# Patient Record
Sex: Male | Born: 1945 | Race: White | Hispanic: No | Marital: Married | Smoking: Never smoker
Health system: Southern US, Community
[De-identification: ages and names within clinical notes are randomized; demographics above are authoritative.]

## PROBLEM LIST (undated history)

## (undated) DIAGNOSIS — I82409 Acute embolism and thrombosis of unspecified deep veins of unspecified lower extremity: Secondary | ICD-10-CM

## (undated) DIAGNOSIS — I10 Essential (primary) hypertension: Secondary | ICD-10-CM

## (undated) DIAGNOSIS — N289 Disorder of kidney and ureter, unspecified: Secondary | ICD-10-CM

## (undated) HISTORY — PX: HERNIA REPAIR: SHX51

## (undated) HISTORY — PX: CHOLECYSTECTOMY: SHX55

---

## 2001-04-18 ENCOUNTER — Emergency Department (HOSPITAL_COMMUNITY): Admission: EM | Admit: 2001-04-18 | Discharge: 2001-04-18 | Payer: Self-pay | Admitting: Emergency Medicine

## 2001-04-18 ENCOUNTER — Encounter: Payer: Self-pay | Admitting: Emergency Medicine

## 2001-09-16 ENCOUNTER — Emergency Department (HOSPITAL_COMMUNITY): Admission: EM | Admit: 2001-09-16 | Discharge: 2001-09-16 | Payer: Self-pay | Admitting: Emergency Medicine

## 2005-09-21 ENCOUNTER — Ambulatory Visit (HOSPITAL_COMMUNITY): Admission: RE | Admit: 2005-09-21 | Discharge: 2005-09-21 | Payer: Self-pay | Admitting: Internal Medicine

## 2005-09-21 ENCOUNTER — Encounter: Payer: Self-pay | Admitting: Vascular Surgery

## 2005-09-21 ENCOUNTER — Ambulatory Visit: Payer: Self-pay | Admitting: Internal Medicine

## 2005-09-25 ENCOUNTER — Ambulatory Visit: Payer: Self-pay | Admitting: *Deleted

## 2005-09-25 ENCOUNTER — Ambulatory Visit: Payer: Self-pay | Admitting: Internal Medicine

## 2005-10-08 ENCOUNTER — Ambulatory Visit: Payer: Self-pay | Admitting: Internal Medicine

## 2005-12-21 ENCOUNTER — Ambulatory Visit: Payer: Self-pay | Admitting: Internal Medicine

## 2008-07-21 ENCOUNTER — Ambulatory Visit: Payer: Self-pay | Admitting: Internal Medicine

## 2008-07-21 DIAGNOSIS — I447 Left bundle-branch block, unspecified: Secondary | ICD-10-CM

## 2008-07-21 DIAGNOSIS — Z87442 Personal history of urinary calculi: Secondary | ICD-10-CM

## 2008-07-21 DIAGNOSIS — F528 Other sexual dysfunction not due to a substance or known physiological condition: Secondary | ICD-10-CM

## 2008-07-21 LAB — CONVERTED CEMR LAB
Alkaline Phosphatase: 58 units/L (ref 39–117)
BUN: 16 mg/dL (ref 6–23)
Basophils Relative: 0.1 % (ref 0.0–3.0)
Bilirubin Urine: NEGATIVE
Bilirubin, Direct: 0.1 mg/dL (ref 0.0–0.3)
Calcium: 9.2 mg/dL (ref 8.4–10.5)
Chloride: 110 meq/L (ref 96–112)
Creatinine, Ser: 1 mg/dL (ref 0.4–1.5)
Eosinophils Absolute: 0.1 10*3/uL (ref 0.0–0.7)
Eosinophils Relative: 1.5 % (ref 0.0–5.0)
HDL: 37.9 mg/dL — ABNORMAL LOW (ref >39.00)
Hemoglobin, Urine: NEGATIVE
Ketones, ur: NEGATIVE mg/dL
LDL Cholesterol: 142 mg/dL — ABNORMAL HIGH (ref 0–99)
Lymphocytes Relative: 32.9 % (ref 12.0–46.0)
MCHC: 35.1 g/dL (ref 30.0–36.0)
MCV: 95.1 fL (ref 78.0–100.0)
Monocytes Absolute: 0.5 10*3/uL (ref 0.1–1.0)
Neutrophils Relative %: 57.6 % (ref 43.0–77.0)
Platelets: 174 10*3/uL (ref 150.0–400.0)
RBC: 4.95 M/uL (ref 4.22–5.81)
TSH: 1.95 microintl units/mL (ref 0.35–5.50)
Total Bilirubin: 0.9 mg/dL (ref 0.3–1.2)
Total CHOL/HDL Ratio: 5
Total Protein, Urine: NEGATIVE mg/dL
Total Protein: 7.5 g/dL (ref 6.0–8.3)
Triglycerides: 99 mg/dL (ref 0.0–149.0)
Urine Glucose: NEGATIVE mg/dL
Urobilinogen, UA: 0.2 (ref 0.0–1.0)
WBC: 6.2 10*3/uL (ref 4.5–10.5)

## 2008-07-23 ENCOUNTER — Telehealth (INDEPENDENT_AMBULATORY_CARE_PROVIDER_SITE_OTHER): Payer: Self-pay | Admitting: *Deleted

## 2008-08-06 ENCOUNTER — Emergency Department (HOSPITAL_COMMUNITY): Admission: EM | Admit: 2008-08-06 | Discharge: 2008-08-06 | Payer: Self-pay | Admitting: Emergency Medicine

## 2008-08-10 ENCOUNTER — Ambulatory Visit: Payer: Self-pay | Admitting: Cardiovascular Disease

## 2008-08-10 ENCOUNTER — Ambulatory Visit: Payer: Self-pay

## 2008-08-10 ENCOUNTER — Encounter: Payer: Self-pay | Admitting: Internal Medicine

## 2008-08-24 ENCOUNTER — Encounter: Payer: Self-pay | Admitting: Internal Medicine

## 2008-09-20 ENCOUNTER — Encounter: Payer: Self-pay | Admitting: Internal Medicine

## 2008-09-23 ENCOUNTER — Encounter: Payer: Self-pay | Admitting: Internal Medicine

## 2008-09-28 ENCOUNTER — Ambulatory Visit: Payer: Self-pay | Admitting: Internal Medicine

## 2008-10-07 ENCOUNTER — Ambulatory Visit (HOSPITAL_COMMUNITY): Admission: RE | Admit: 2008-10-07 | Discharge: 2008-10-07 | Payer: Self-pay | Admitting: Urology

## 2008-10-28 ENCOUNTER — Encounter: Payer: Self-pay | Admitting: Internal Medicine

## 2009-07-21 ENCOUNTER — Telehealth (INDEPENDENT_AMBULATORY_CARE_PROVIDER_SITE_OTHER): Payer: Self-pay | Admitting: *Deleted

## 2010-04-06 NOTE — Progress Notes (Signed)
  Phone Note Other Incoming   Request: Send information Summary of Call: Kevin Caldwell medical release received from patient requesting copies of his records from January 2007 to May 2011. Request forwarded to [employer].

## 2010-06-12 LAB — URINALYSIS, ROUTINE W REFLEX MICROSCOPIC
Glucose, UA: NEGATIVE mg/dL
Ketones, ur: NEGATIVE mg/dL
Leukocytes, UA: NEGATIVE
Nitrite: NEGATIVE
Protein, ur: NEGATIVE mg/dL
Specific Gravity, Urine: 1.03 (ref 1.005–1.030)
Urobilinogen, UA: 0.2 mg/dL (ref 0.0–1.0)
pH: 5 (ref 5.0–8.0)

## 2010-06-12 LAB — URINE MICROSCOPIC-ADD ON

## 2010-07-21 NOTE — Assessment & Plan Note (Signed)
[facility]                             PRIMARY CARE OFFICE NOTE   WEST, BOOMERSHINE                       MRN:          [id number]  DATE:09/21/2005                            DOB:          Mar 27, 1945    CHIEF COMPLAINT:  New patient to practice referred by Dr. Andrey Campanile.   HISTORY OF PRESENT ILLNESS:  The patient is a 65 year old white male who has  been relatively healthy for most of his life here to establish primary care.  [facility] and flu-like  symptoms.  He recently returned from a trip to the [place]. He works in  the heating and air conditioning business and spent several days in [place]  as well as [place] and [place].  He states that he was in his usual state of  health before this trip.  However, within the first 12 hours of arriving in  [place], the patient experienced an abdominal taste in his mouth and also flu-  like symptoms with fever and headache.  The patient also during his trip had  intermittent loose stools and watery diarrhea.  He attributes these loose  stools to change in diet and eating more fruits while he was on this trip.  His loose stools have started to resolve.   Upon his return he [facility] and  numerous labs were obtained including tests for malaria and a TB skin test  was checked.  EKG was checked as well as a chest x-ray.  The patient notes  that he has history of left bundle branch block.  It is unclear how old this  is.  Apparently a 2-D echo was also ordered and a preliminary report showed  normal ejection fraction.   He describes cutting a small tree in his back yard prior to his trip.  A  tree limb grazed the right side of his head.  He does not know if that  incident has anything to do with some mild discomfort in his left eye.  He  states that his eyelids sometimes are sticking together.   REVIEW OF SYSTEMS:  As  noted above, he does note some increased ankle  swelling during the trip.  He was on a plane flight for a prolonged time.  Ankle swelling has come undone.  However, over the last 24 to 48 hours, he  has noticed increased swelling in his left lower extremity with streaking  near his ankle.   PAST MEDICAL HISTORY:  1.  Status post left inguinal hernia repair in 1974.  2.  Status post cholecystectomy in 1980s.  3.  Obesity.   CURRENT MEDICATIONS:  He is finishing a course of azithromycin.   ALLERGIES:  None noted.   SOCIAL HISTORY:  The patient is married.  Has two children.  Denies any  alcohol or tobacco use.  No history of recreational drug use.  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   FAMILY HISTORY:  The patient's mother is  deceased at age 21.  Had a past  medical history of type 2 diabetes, stroke, left-sided paralysis.  Father is  still alive at age 66 and has history of coronary artery disease, colon  cancer, status post colectomy.  Colon cancer occurred late in life.   PREVENTIVE CARE HISTORY:  He has had a colonoscopy back in 2004 which was  reported as unremarkable.   PHYSICAL EXAMINATION:  VITAL SIGNS: Height is 5 feet 10 inches, weight 266  pounds.  Temperature 97.5, pulse 68, BP 131/85.  GENERAL:  The patient is a pleasant, overweight 65 year old white male in no  apparent distress.  He appears his stated age.  Appears somewhat washed out.  HEENT:  Normocephalic, atraumatic.  Pupils are equal and reactive to light  bilaterally.  Extraocular movements intact.  Patient ws anicteric.  Conjunctivae within normal limits.  There was no conjunctival petechiae.  Tympanic membranes clear bilaterally. No sign of infection.  Oropharyngeal  exam:  Crowded oral airway.  However, no other oropharyngeal lesions were  noted.  The patient had normal dentition.  NECK:  Supple.  No adenopathy, carotid bruits or thyromegaly.  CHEST:   Normal respiratory effort.  Chest is clear to auscultation  bilaterally.  No rhonchi, rales or wheezing.  CARDIOVASCULAR:  Regular rate and rhythm.  No significant murmurs, rubs or  gallops appreciated.  ABDOMEN:  Protuberant, obese.  He has a right upper quadrant scar.  No  obvious hernias and unable to appreciate hepatosplenomegaly. Examination of  his axilla and groin revealed some fullness in his right axilla, maybe  associated with lymphadenopathy.  There was no lymphadenopathy in his  inguinal area bilaterally.  SKIN:  No obvious rashes in his upper torso.  EXTREMITIES:  Examination of the lower extremities revealed an area of  erythema above his ankle that was circumferential, associated with pitting  edema.  The patient had mild tenderness in that area.  Pedis dorsalis pulses  were equal and symmetric.  NEUROLOGIC:  Cranial nerves II-XII intact.  He was nonfocal.   IMPRESSION/RECOMMENDATIONS:  1.  Fever with flu-like symptoms of unclear etiology.  2.  Left lower extremity edema, possible cellulitis versus deep venous      thrombosis.  3.  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  4.  Health maintenance.   RECOMMENDATIONS:  At this time we will broaden out data base.  I am somewhat  concerned that his left lower extremity may be secondary to DVT.  He  certainly has multiple risk factors including prolonged plane ride over to  the [place].  Some of his fever certainly could be attributable to  possible deep venous thrombosis.  However, also concerning his possible  lymphadenopathy in his right axilla.  We will try to obtain his blood test  results. Hopefully they  have the blood cultures and a copy of his chest x-ray, EKG and recent 2-D  echo.  Further management will be based upon testing results.                                   Barbette Hair. Artist Pais, DO   RDY/MedQ  DD:  09/21/2005  DT:  09/21/2005  Job #:  [id number]

## 2011-03-21 DIAGNOSIS — E291 Testicular hypofunction: Secondary | ICD-10-CM | POA: Diagnosis not present

## 2011-04-11 DIAGNOSIS — E291 Testicular hypofunction: Secondary | ICD-10-CM | POA: Diagnosis not present

## 2011-05-02 DIAGNOSIS — E291 Testicular hypofunction: Secondary | ICD-10-CM | POA: Diagnosis not present

## 2011-05-23 DIAGNOSIS — E291 Testicular hypofunction: Secondary | ICD-10-CM | POA: Diagnosis not present

## 2011-06-13 DIAGNOSIS — E291 Testicular hypofunction: Secondary | ICD-10-CM | POA: Diagnosis not present

## 2011-07-04 DIAGNOSIS — E291 Testicular hypofunction: Secondary | ICD-10-CM | POA: Diagnosis not present

## 2011-07-25 DIAGNOSIS — E291 Testicular hypofunction: Secondary | ICD-10-CM | POA: Diagnosis not present

## 2011-08-23 DIAGNOSIS — Z0000 Encounter for general adult medical examination without abnormal findings: Secondary | ICD-10-CM | POA: Diagnosis not present

## 2011-08-23 DIAGNOSIS — E559 Vitamin D deficiency, unspecified: Secondary | ICD-10-CM | POA: Diagnosis not present

## 2011-08-23 DIAGNOSIS — E669 Obesity, unspecified: Secondary | ICD-10-CM | POA: Diagnosis not present

## 2011-08-23 DIAGNOSIS — Z23 Encounter for immunization: Secondary | ICD-10-CM | POA: Diagnosis not present

## 2011-08-23 DIAGNOSIS — E291 Testicular hypofunction: Secondary | ICD-10-CM | POA: Diagnosis not present

## 2011-08-23 DIAGNOSIS — E785 Hyperlipidemia, unspecified: Secondary | ICD-10-CM | POA: Diagnosis not present

## 2011-08-23 DIAGNOSIS — Z79899 Other long term (current) drug therapy: Secondary | ICD-10-CM | POA: Diagnosis not present

## 2011-08-23 DIAGNOSIS — Z1331 Encounter for screening for depression: Secondary | ICD-10-CM | POA: Diagnosis not present

## 2011-09-12 DIAGNOSIS — E291 Testicular hypofunction: Secondary | ICD-10-CM | POA: Diagnosis not present

## 2011-10-03 DIAGNOSIS — E291 Testicular hypofunction: Secondary | ICD-10-CM | POA: Diagnosis not present

## 2011-10-23 DIAGNOSIS — E291 Testicular hypofunction: Secondary | ICD-10-CM | POA: Diagnosis not present

## 2011-11-15 DIAGNOSIS — E291 Testicular hypofunction: Secondary | ICD-10-CM | POA: Diagnosis not present

## 2011-12-05 DIAGNOSIS — E291 Testicular hypofunction: Secondary | ICD-10-CM | POA: Diagnosis not present

## 2011-12-26 DIAGNOSIS — E291 Testicular hypofunction: Secondary | ICD-10-CM | POA: Diagnosis not present

## 2012-01-16 DIAGNOSIS — E291 Testicular hypofunction: Secondary | ICD-10-CM | POA: Diagnosis not present

## 2012-02-06 DIAGNOSIS — E291 Testicular hypofunction: Secondary | ICD-10-CM | POA: Diagnosis not present

## 2012-02-06 DIAGNOSIS — Z23 Encounter for immunization: Secondary | ICD-10-CM | POA: Diagnosis not present

## 2012-03-19 DIAGNOSIS — E291 Testicular hypofunction: Secondary | ICD-10-CM | POA: Diagnosis not present

## 2012-04-09 DIAGNOSIS — E291 Testicular hypofunction: Secondary | ICD-10-CM | POA: Diagnosis not present

## 2012-05-21 DIAGNOSIS — E291 Testicular hypofunction: Secondary | ICD-10-CM | POA: Diagnosis not present

## 2012-06-11 DIAGNOSIS — E291 Testicular hypofunction: Secondary | ICD-10-CM | POA: Diagnosis not present

## 2012-07-02 DIAGNOSIS — E291 Testicular hypofunction: Secondary | ICD-10-CM | POA: Diagnosis not present

## 2012-07-16 DIAGNOSIS — E291 Testicular hypofunction: Secondary | ICD-10-CM | POA: Diagnosis not present

## 2012-08-06 DIAGNOSIS — E291 Testicular hypofunction: Secondary | ICD-10-CM | POA: Diagnosis not present

## 2012-09-08 DIAGNOSIS — Z1331 Encounter for screening for depression: Secondary | ICD-10-CM | POA: Diagnosis not present

## 2012-09-08 DIAGNOSIS — Z125 Encounter for screening for malignant neoplasm of prostate: Secondary | ICD-10-CM | POA: Diagnosis not present

## 2012-09-08 DIAGNOSIS — I447 Left bundle-branch block, unspecified: Secondary | ICD-10-CM | POA: Diagnosis not present

## 2012-09-08 DIAGNOSIS — E785 Hyperlipidemia, unspecified: Secondary | ICD-10-CM | POA: Diagnosis not present

## 2012-09-08 DIAGNOSIS — Z79899 Other long term (current) drug therapy: Secondary | ICD-10-CM | POA: Diagnosis not present

## 2012-09-08 DIAGNOSIS — E559 Vitamin D deficiency, unspecified: Secondary | ICD-10-CM | POA: Diagnosis not present

## 2012-09-08 DIAGNOSIS — E291 Testicular hypofunction: Secondary | ICD-10-CM | POA: Diagnosis not present

## 2012-09-08 DIAGNOSIS — Z0000 Encounter for general adult medical examination without abnormal findings: Secondary | ICD-10-CM | POA: Diagnosis not present

## 2012-09-08 DIAGNOSIS — E669 Obesity, unspecified: Secondary | ICD-10-CM | POA: Diagnosis not present

## 2012-09-29 DIAGNOSIS — E291 Testicular hypofunction: Secondary | ICD-10-CM | POA: Diagnosis not present

## 2012-10-20 DIAGNOSIS — E291 Testicular hypofunction: Secondary | ICD-10-CM | POA: Diagnosis not present

## 2012-11-21 DIAGNOSIS — E291 Testicular hypofunction: Secondary | ICD-10-CM | POA: Diagnosis not present

## 2012-12-02 DIAGNOSIS — E291 Testicular hypofunction: Secondary | ICD-10-CM | POA: Diagnosis not present

## 2012-12-22 DIAGNOSIS — Z23 Encounter for immunization: Secondary | ICD-10-CM | POA: Diagnosis not present

## 2012-12-22 DIAGNOSIS — E291 Testicular hypofunction: Secondary | ICD-10-CM | POA: Diagnosis not present

## 2013-01-13 DIAGNOSIS — E291 Testicular hypofunction: Secondary | ICD-10-CM | POA: Diagnosis not present

## 2013-02-03 DIAGNOSIS — E291 Testicular hypofunction: Secondary | ICD-10-CM | POA: Diagnosis not present

## 2013-02-24 DIAGNOSIS — H5200 Hypermetropia, unspecified eye: Secondary | ICD-10-CM | POA: Diagnosis not present

## 2013-02-24 DIAGNOSIS — H52209 Unspecified astigmatism, unspecified eye: Secondary | ICD-10-CM | POA: Diagnosis not present

## 2013-02-24 DIAGNOSIS — E291 Testicular hypofunction: Secondary | ICD-10-CM | POA: Diagnosis not present

## 2013-02-24 DIAGNOSIS — H524 Presbyopia: Secondary | ICD-10-CM | POA: Diagnosis not present

## 2013-03-17 DIAGNOSIS — E291 Testicular hypofunction: Secondary | ICD-10-CM | POA: Diagnosis not present

## 2013-04-07 DIAGNOSIS — E291 Testicular hypofunction: Secondary | ICD-10-CM | POA: Diagnosis not present

## 2013-05-05 DIAGNOSIS — E291 Testicular hypofunction: Secondary | ICD-10-CM | POA: Diagnosis not present

## 2013-05-13 DIAGNOSIS — B353 Tinea pedis: Secondary | ICD-10-CM | POA: Diagnosis not present

## 2013-05-13 DIAGNOSIS — L739 Follicular disorder, unspecified: Secondary | ICD-10-CM | POA: Diagnosis not present

## 2013-05-13 DIAGNOSIS — B351 Tinea unguium: Secondary | ICD-10-CM | POA: Diagnosis not present

## 2013-05-13 DIAGNOSIS — L723 Sebaceous cyst: Secondary | ICD-10-CM | POA: Diagnosis not present

## 2013-05-13 DIAGNOSIS — L719 Rosacea, unspecified: Secondary | ICD-10-CM | POA: Diagnosis not present

## 2013-05-13 DIAGNOSIS — L821 Other seborrheic keratosis: Secondary | ICD-10-CM | POA: Diagnosis not present

## 2013-05-25 DIAGNOSIS — E291 Testicular hypofunction: Secondary | ICD-10-CM | POA: Diagnosis not present

## 2013-06-16 DIAGNOSIS — E291 Testicular hypofunction: Secondary | ICD-10-CM | POA: Diagnosis not present

## 2013-07-07 DIAGNOSIS — E291 Testicular hypofunction: Secondary | ICD-10-CM | POA: Diagnosis not present

## 2013-07-15 DIAGNOSIS — B351 Tinea unguium: Secondary | ICD-10-CM | POA: Diagnosis not present

## 2013-07-15 DIAGNOSIS — L821 Other seborrheic keratosis: Secondary | ICD-10-CM | POA: Diagnosis not present

## 2013-07-15 DIAGNOSIS — L719 Rosacea, unspecified: Secondary | ICD-10-CM | POA: Diagnosis not present

## 2013-07-15 DIAGNOSIS — L570 Actinic keratosis: Secondary | ICD-10-CM | POA: Diagnosis not present

## 2013-07-15 DIAGNOSIS — L820 Inflamed seborrheic keratosis: Secondary | ICD-10-CM | POA: Diagnosis not present

## 2013-07-15 DIAGNOSIS — L739 Follicular disorder, unspecified: Secondary | ICD-10-CM | POA: Diagnosis not present

## 2013-07-28 DIAGNOSIS — E291 Testicular hypofunction: Secondary | ICD-10-CM | POA: Diagnosis not present

## 2013-08-18 DIAGNOSIS — E291 Testicular hypofunction: Secondary | ICD-10-CM | POA: Diagnosis not present

## 2013-09-08 DIAGNOSIS — E291 Testicular hypofunction: Secondary | ICD-10-CM | POA: Diagnosis not present

## 2013-09-15 DIAGNOSIS — E785 Hyperlipidemia, unspecified: Secondary | ICD-10-CM | POA: Diagnosis not present

## 2013-09-15 DIAGNOSIS — Z125 Encounter for screening for malignant neoplasm of prostate: Secondary | ICD-10-CM | POA: Diagnosis not present

## 2013-09-15 DIAGNOSIS — Z0000 Encounter for general adult medical examination without abnormal findings: Secondary | ICD-10-CM | POA: Diagnosis not present

## 2013-09-15 DIAGNOSIS — Z1331 Encounter for screening for depression: Secondary | ICD-10-CM | POA: Diagnosis not present

## 2013-09-15 DIAGNOSIS — E669 Obesity, unspecified: Secondary | ICD-10-CM | POA: Diagnosis not present

## 2013-09-15 DIAGNOSIS — E291 Testicular hypofunction: Secondary | ICD-10-CM | POA: Diagnosis not present

## 2013-09-15 DIAGNOSIS — E559 Vitamin D deficiency, unspecified: Secondary | ICD-10-CM | POA: Diagnosis not present

## 2013-09-15 DIAGNOSIS — R030 Elevated blood-pressure reading, without diagnosis of hypertension: Secondary | ICD-10-CM | POA: Diagnosis not present

## 2013-09-15 DIAGNOSIS — Z86010 Personal history of colonic polyps: Secondary | ICD-10-CM | POA: Diagnosis not present

## 2013-09-15 DIAGNOSIS — I447 Left bundle-branch block, unspecified: Secondary | ICD-10-CM | POA: Diagnosis not present

## 2013-09-29 DIAGNOSIS — E291 Testicular hypofunction: Secondary | ICD-10-CM | POA: Diagnosis not present

## 2013-10-27 DIAGNOSIS — R030 Elevated blood-pressure reading, without diagnosis of hypertension: Secondary | ICD-10-CM | POA: Diagnosis not present

## 2013-10-27 DIAGNOSIS — E291 Testicular hypofunction: Secondary | ICD-10-CM | POA: Diagnosis not present

## 2013-11-16 DIAGNOSIS — E785 Hyperlipidemia, unspecified: Secondary | ICD-10-CM

## 2013-12-01 DIAGNOSIS — E291 Testicular hypofunction: Secondary | ICD-10-CM | POA: Diagnosis not present

## 2013-12-29 DIAGNOSIS — E291 Testicular hypofunction: Secondary | ICD-10-CM | POA: Diagnosis not present

## 2014-01-21 DIAGNOSIS — Z09 Encounter for follow-up examination after completed treatment for conditions other than malignant neoplasm: Secondary | ICD-10-CM | POA: Diagnosis not present

## 2014-01-21 DIAGNOSIS — Z86010 Personal history of colonic polyps: Secondary | ICD-10-CM | POA: Diagnosis not present

## 2014-01-26 DIAGNOSIS — E291 Testicular hypofunction: Secondary | ICD-10-CM | POA: Diagnosis not present

## 2014-01-26 DIAGNOSIS — Z23 Encounter for immunization: Secondary | ICD-10-CM | POA: Diagnosis not present

## 2014-02-09 DIAGNOSIS — E291 Testicular hypofunction: Secondary | ICD-10-CM | POA: Diagnosis not present

## 2014-03-23 DIAGNOSIS — E291 Testicular hypofunction: Secondary | ICD-10-CM | POA: Diagnosis not present

## 2014-04-14 DIAGNOSIS — E291 Testicular hypofunction: Secondary | ICD-10-CM | POA: Diagnosis not present

## 2014-05-04 DIAGNOSIS — E291 Testicular hypofunction: Secondary | ICD-10-CM | POA: Diagnosis not present

## 2014-05-18 DIAGNOSIS — Z52011 Autologous donor, stem cells: Secondary | ICD-10-CM

## 2014-05-25 DIAGNOSIS — E291 Testicular hypofunction: Secondary | ICD-10-CM | POA: Diagnosis not present

## 2014-07-06 DIAGNOSIS — E291 Testicular hypofunction: Secondary | ICD-10-CM | POA: Diagnosis not present

## 2014-09-07 DIAGNOSIS — E291 Testicular hypofunction: Secondary | ICD-10-CM | POA: Diagnosis not present

## 2014-09-20 DIAGNOSIS — Z125 Encounter for screening for malignant neoplasm of prostate: Secondary | ICD-10-CM | POA: Diagnosis not present

## 2014-09-20 DIAGNOSIS — Z79899 Other long term (current) drug therapy: Secondary | ICD-10-CM | POA: Diagnosis not present

## 2014-09-20 DIAGNOSIS — I809 Phlebitis and thrombophlebitis of unspecified site: Secondary | ICD-10-CM | POA: Diagnosis not present

## 2014-09-20 DIAGNOSIS — E291 Testicular hypofunction: Secondary | ICD-10-CM | POA: Diagnosis not present

## 2014-09-20 DIAGNOSIS — I447 Left bundle-branch block, unspecified: Secondary | ICD-10-CM | POA: Diagnosis not present

## 2014-09-20 DIAGNOSIS — Z0001 Encounter for general adult medical examination with abnormal findings: Secondary | ICD-10-CM | POA: Diagnosis not present

## 2014-09-20 DIAGNOSIS — Z6836 Body mass index (BMI) 36.0-36.9, adult: Secondary | ICD-10-CM | POA: Diagnosis not present

## 2014-09-20 DIAGNOSIS — M722 Plantar fascial fibromatosis: Secondary | ICD-10-CM | POA: Diagnosis not present

## 2014-09-20 DIAGNOSIS — I10 Essential (primary) hypertension: Secondary | ICD-10-CM | POA: Diagnosis not present

## 2014-09-20 DIAGNOSIS — Z1389 Encounter for screening for other disorder: Secondary | ICD-10-CM | POA: Diagnosis not present

## 2014-09-20 DIAGNOSIS — E784 Other hyperlipidemia: Secondary | ICD-10-CM | POA: Diagnosis not present

## 2014-09-20 DIAGNOSIS — E559 Vitamin D deficiency, unspecified: Secondary | ICD-10-CM | POA: Diagnosis not present

## 2014-11-17 DIAGNOSIS — Z23 Encounter for immunization: Secondary | ICD-10-CM | POA: Diagnosis not present

## 2014-11-17 DIAGNOSIS — E291 Testicular hypofunction: Secondary | ICD-10-CM | POA: Diagnosis not present

## 2014-11-30 DIAGNOSIS — E291 Testicular hypofunction: Secondary | ICD-10-CM | POA: Diagnosis not present

## 2014-11-30 DIAGNOSIS — I10 Essential (primary) hypertension: Secondary | ICD-10-CM | POA: Diagnosis not present

## 2014-11-30 DIAGNOSIS — Z6833 Body mass index (BMI) 33.0-33.9, adult: Secondary | ICD-10-CM | POA: Diagnosis not present

## 2015-01-19 DIAGNOSIS — E291 Testicular hypofunction: Secondary | ICD-10-CM | POA: Diagnosis not present

## 2015-02-11 DIAGNOSIS — E291 Testicular hypofunction: Secondary | ICD-10-CM | POA: Diagnosis not present

## 2015-03-03 DIAGNOSIS — E291 Testicular hypofunction: Secondary | ICD-10-CM | POA: Diagnosis not present

## 2015-03-23 DIAGNOSIS — E291 Testicular hypofunction: Secondary | ICD-10-CM | POA: Diagnosis not present

## 2015-05-02 DIAGNOSIS — I10 Essential (primary) hypertension: Secondary | ICD-10-CM | POA: Diagnosis not present

## 2015-05-02 DIAGNOSIS — M545 Low back pain: Secondary | ICD-10-CM | POA: Diagnosis not present

## 2015-05-02 DIAGNOSIS — E291 Testicular hypofunction: Secondary | ICD-10-CM | POA: Diagnosis not present

## 2015-06-22 ENCOUNTER — Emergency Department (HOSPITAL_COMMUNITY)
Admission: EM | Admit: 2015-06-22 | Discharge: 2015-06-22 | Disposition: A | Discharge status: Home / Self Care | Payer: Medicare Other | Attending: Emergency Medicine | Admitting: Emergency Medicine

## 2015-06-22 ENCOUNTER — Emergency Department (HOSPITAL_COMMUNITY): Payer: Medicare Other

## 2015-06-22 ENCOUNTER — Encounter (HOSPITAL_COMMUNITY): Payer: Self-pay

## 2015-06-22 DIAGNOSIS — N200 Calculus of kidney: Secondary | ICD-10-CM

## 2015-06-22 DIAGNOSIS — Z9049 Acquired absence of other specified parts of digestive tract: Secondary | ICD-10-CM | POA: Diagnosis not present

## 2015-06-22 DIAGNOSIS — N132 Hydronephrosis with renal and ureteral calculous obstruction: Secondary | ICD-10-CM | POA: Diagnosis not present

## 2015-06-22 DIAGNOSIS — Z79899 Other long term (current) drug therapy: Secondary | ICD-10-CM | POA: Insufficient documentation

## 2015-06-22 DIAGNOSIS — N202 Calculus of kidney with calculus of ureter: Secondary | ICD-10-CM | POA: Diagnosis not present

## 2015-06-22 DIAGNOSIS — R109 Unspecified abdominal pain: Secondary | ICD-10-CM | POA: Diagnosis present

## 2015-06-22 DIAGNOSIS — I10 Essential (primary) hypertension: Secondary | ICD-10-CM | POA: Insufficient documentation

## 2015-06-22 DIAGNOSIS — Z9889 Other specified postprocedural states: Secondary | ICD-10-CM | POA: Insufficient documentation

## 2015-06-22 HISTORY — DX: Disorder of kidney and ureter, unspecified: N28.9

## 2015-06-22 HISTORY — DX: Essential (primary) hypertension: I10

## 2015-06-22 LAB — COMPREHENSIVE METABOLIC PANEL
ALK PHOS: 42 U/L (ref 38–126)
ALT: 19 U/L (ref 17–63)
ANION GAP: 11 (ref 5–15)
AST: 23 U/L (ref 15–41)
Albumin: 4.5 g/dL (ref 3.5–5.0)
BILIRUBIN TOTAL: 1.1 mg/dL (ref 0.3–1.2)
BUN: 24 mg/dL — ABNORMAL HIGH (ref 6–20)
CALCIUM: 9.5 mg/dL (ref 8.9–10.3)
CO2: 25 mmol/L (ref 22–32)
Chloride: 101 mmol/L (ref 101–111)
Creatinine, Ser: 1.21 mg/dL (ref 0.61–1.24)
GFR, EST NON AFRICAN AMERICAN: 59 mL/min — AB (ref >60)
Glucose, Bld: 114 mg/dL — ABNORMAL HIGH (ref 65–99)
Potassium: 4.3 mmol/L (ref 3.5–5.1)
SODIUM: 137 mmol/L (ref 135–145)
TOTAL PROTEIN: 7.8 g/dL (ref 6.5–8.1)

## 2015-06-22 LAB — CBC WITH DIFFERENTIAL/PLATELET
BASOS ABS: 0 10*3/uL (ref 0.0–0.1)
BASOS PCT: 0 %
EOS ABS: 0.2 10*3/uL (ref 0.0–0.7)
Eosinophils Relative: 2 %
HEMATOCRIT: 46.8 % (ref 39.0–52.0)
HEMOGLOBIN: 16.6 g/dL (ref 13.0–17.0)
Lymphocytes Relative: 13 %
Lymphs Abs: 1.4 10*3/uL (ref 0.7–4.0)
MCH: 32.4 pg (ref 26.0–34.0)
MCHC: 35.5 g/dL (ref 30.0–36.0)
MCV: 91.4 fL (ref 78.0–100.0)
Monocytes Absolute: 0.7 10*3/uL (ref 0.1–1.0)
Monocytes Relative: 6 %
NEUTROS ABS: 8.5 10*3/uL — AB (ref 1.7–7.7)
NEUTROS PCT: 79 %
Platelets: 155 10*3/uL (ref 150–400)
RBC: 5.12 MIL/uL (ref 4.22–5.81)
RDW: 13.3 % (ref 11.5–15.5)
WBC: 10.7 10*3/uL — AB (ref 4.0–10.5)

## 2015-06-22 LAB — URINALYSIS, ROUTINE W REFLEX MICROSCOPIC
Bilirubin Urine: NEGATIVE
Glucose, UA: NEGATIVE mg/dL
KETONES UR: NEGATIVE mg/dL
LEUKOCYTES UA: NEGATIVE
NITRITE: NEGATIVE
PH: 5 (ref 5.0–8.0)
Protein, ur: NEGATIVE mg/dL
SPECIFIC GRAVITY, URINE: 1.018 (ref 1.005–1.030)

## 2015-06-22 LAB — LIPASE, BLOOD: LIPASE: 86 U/L — AB (ref 11–51)

## 2015-06-22 LAB — URINE MICROSCOPIC-ADD ON
Bacteria, UA: NONE SEEN
WBC UA: NONE SEEN WBC/hpf (ref 0–5)

## 2015-06-22 MED ORDER — OXYCODONE-ACETAMINOPHEN 5-325 MG PO TABS: 1.0000 | ORAL_TABLET | ORAL | Status: DC | PRN

## 2015-06-22 MED ORDER — MORPHINE SULFATE (PF) 4 MG/ML IV SOLN
4.0000 mg | Freq: Once | INTRAVENOUS | Status: AC
  Administered 2015-06-22: 4 mg via INTRAVENOUS
  Filled 2015-06-22: qty 1

## 2015-06-22 MED ORDER — IBUPROFEN 800 MG PO TABS: 800.0000 mg | ORAL_TABLET | Freq: Three times a day (TID) | ORAL | Status: DC

## 2015-06-22 MED ORDER — SODIUM CHLORIDE 0.9 % IV BOLUS (SEPSIS)
1000.0000 mL | Freq: Once | INTRAVENOUS | Status: AC
  Administered 2015-06-22: 1000 mL via INTRAVENOUS

## 2015-06-22 MED ORDER — TAMSULOSIN HCL 0.4 MG PO CAPS: 0.4000 mg | ORAL_CAPSULE | Freq: Every day | ORAL | Status: DC

## 2015-06-22 NOTE — ED Provider Notes (Signed)
CSN: [id number]     Arrival date & time 06/22/15  1719 History   First MD Initiated Contact with Patient 06/22/15 1752     Chief Complaint  Patient presents with  . Flank Pain     (Consider location/radiation/quality/duration/timing/severity/associated sxs/prior Treatment) Patient is a 70 y.o. male presenting with flank pain.  Flank Pain Pertinent negatives include no abdominal pain, chest pain, chills, coughing, fever, nausea, urinary symptoms or vomiting.   Kevin Caldwell is a 70 y.o. male with PMH significant for kidney stones and HTN who presents with sudden onset, constant, moderate, "pressure-like" left flank pain approximately 12 PM today.  He reports this feels similar to prior stones with the last being 7 years ago. No modifying factors.  He states he can't seem to get comfortable.  He has tried positional changes and walking with no relief.  He reports taking 1 tablet of Advil without relief.  No aggravating factors.  He reports one episode of nausea when he arrived to the ED, but states this has resolved.  He denies fever, cough, CP, SOB, abdominal pain, urinary symptoms, or N/V/D.    Past Medical History  Diagnosis Date  . Renal disorder   . Hypertension    Past Surgical History  Procedure Laterality Date  . Hernia repair    . Cholecystectomy     Family History  Problem Relation Age of Onset  . Diabetes Mother   . Stroke Mother   . Heart failure Father    Social History  Substance Use Topics  . Smoking status: Never Smoker   . Smokeless tobacco: Never Used  . Alcohol Use: No    Review of Systems  Constitutional: Negative for fever and chills.  Respiratory: Negative for cough and shortness of breath.   Cardiovascular: Negative for chest pain.  Gastrointestinal: Negative for nausea, vomiting, abdominal pain and diarrhea.  Genitourinary: Positive for flank pain. Negative for dysuria, urgency, frequency and hematuria.  Musculoskeletal: Negative for back pain.   All other systems reviewed and are negative.     Allergies  Review of patient's allergies indicates no known allergies.  Home Medications   Prior to Admission medications   Medication Sig Start Date End Date Taking? Authorizing Provider  cholecalciferol (VITAMIN D) 1000 units tablet Take 1,000 Units by mouth daily.   Yes Historical Provider, MD  losartan-hydrochlorothiazide (HYZAAR) 50-12.5 MG tablet Take 1 tablet by mouth daily.  05/11/15  Yes Historical Provider, MD   BP 159/84 mmHg  Pulse 60  Temp(Src) 98.6 F (37 C) (Oral)  Resp 18  Ht 5' 10.75" (1.797 m)  Wt 99.791 kg  BMI 30.90 kg/m2  SpO2 98% Physical Exam  Constitutional: He is oriented to person, place, and time. He appears well-developed and well-nourished.  Non-toxic appearance. He does not have a sickly appearance. He does not appear ill.  HENT:  Head: Normocephalic and atraumatic.  Mouth/Throat: Oropharynx is clear and moist.  Eyes: Conjunctivae are normal. Pupils are equal, round, and reactive to light.  Neck: Normal range of motion. Neck supple.  Cardiovascular: Normal rate, regular rhythm and normal heart sounds.   No murmur heard. Pulmonary/Chest: Effort normal and breath sounds normal. No accessory muscle usage or stridor. No respiratory distress. He has no wheezes. He has no rhonchi. He has no rales.  Abdominal: Soft. Bowel sounds are normal. He exhibits no distension. There is no tenderness. There is no rigidity, no guarding and no CVA tenderness.  Musculoskeletal: Normal range of motion.  No cervical/thoracic/lumbar  midline tenderness.   Lymphadenopathy:    He has no cervical adenopathy.  Neurological: He is alert and oriented to person, place, and time.  Speech clear without dysarthria.  Skin: Skin is warm and dry.  Psychiatric: He has a normal mood and affect. His behavior is normal.    ED Course  Procedures (including critical care time) Labs Review Labs Reviewed  COMPREHENSIVE METABOLIC PANEL  - Abnormal; Notable for the following:    Glucose, Bld 114 (*)    BUN 24 (*)    GFR calc non Af Amer 59 (*)    All other components within normal limits  CBC WITH DIFFERENTIAL/PLATELET - Abnormal; Notable for the following:    WBC 10.7 (*)    Neutro Abs 8.5 (*)    All other components within normal limits  URINALYSIS, ROUTINE W REFLEX MICROSCOPIC (NOT AT [facility]) - Abnormal; Notable for the following:    Hgb urine dipstick MODERATE (*)    All other components within normal limits  LIPASE, BLOOD - Abnormal; Notable for the following:    Lipase 86 (*)    All other components within normal limits  URINE MICROSCOPIC-ADD ON - Abnormal; Notable for the following:    Squamous Epithelial / LPF 0-5 (*)    All other components within normal limits    Imaging Review Ct Renal Stone Study  06/22/2015  CLINICAL DATA:  Left flank pain. History of renal calculi previously. EXAM: CT ABDOMEN AND PELVIS WITHOUT CONTRAST TECHNIQUE: Multidetector CT imaging of the abdomen and pelvis was performed following the standard protocol without IV contrast. COMPARISON:  08/06/2008 FINDINGS: Lung bases are clear except for mild scarring. There is extensive coronary artery calcification. The liver has a normal appearance without contrast. Previous cholecystectomy. The spleen is normal. The pancreas is normal. The adrenal glands are normal. The right kidney is normal except for a nonobstructing 2 mm stone in the midportion. The left kidney shows mild swelling with. Nephric edema. There are several small renal calculi, approximately 8 in number with the largest stones in the lower pole and upper pole measuring 4-5 mm. There is hydroureteronephrosis with the left ureter being dilated to the level of the mid pelvis where there is obstruction secondary to a 80 6.5 mm in diameter stone. No stone in the bladder. Multiple phleboliths are present in the pelvis. The prostate is mildly enlarged. The aorta shows mild atherosclerosis but no  aneurysm. The IVC is normal. No bowel pathology is seen. No significant bony finding. IMPRESSION: Hydroureteronephrosis on the left a due to a 6.5 mm in diameter stone at the level of the mid pelvis. Approximately 8 nonobstructing renal calculi on the left, the largest measuring 4-5 mm. 2 mm nonobstructing stone on the right. Advanced coronary artery calcification. Previous cholecystectomy. Electronically Signed   By: Nelson Chimes M.D.   On: 06/22/2015 20:39   I have personally reviewed and evaluated these images and lab results as part of my medical decision-making.   EKG Interpretation None      MDM   Final diagnoses:  Nephrolithiasis  Calculus of kidney and ureter   Patient presents with sudden onset left flank pain. History of kidney stones. Last 7 years ago. Patient is well-appearing, nontoxic or septic appearing. Labs show an elevation of BUN at 24. Mild cytosis at 10.7. Mildly elevated lipase at 86. No abdominal pain. On exam, no abdominal tenderness or peritoneal signs. UA remarkable for hematuria. No signs of infection. CT renal stone study shows hydroureteronephrosis on  the left due to a 6.5 mm stone. Approximately 8 nonobstructing renal calculi on the left, the largest measuring 4-5 mm. 2 mm nonobstructing stone on the right. Patient given IVF and morphine. Patient's pain has been controlled. Spoke with urology, Dr. Matilde Sprang.  Patient to follow-up in the office within the week. Plan to discharge home with Percocet, Zofran, and Flomax. Discussed return precautions. Patient agrees and acknowledges the above plan for discharge.  Case has been discussed with Dr. Audie Pinto who agrees with the above plan for discharge.      Gloriann Loan, PA-C 06/22/15 2214  Leonard Schwartz, MD 06/25/15 1043

## 2015-06-22 NOTE — ED Notes (Signed)
Pt cannot use restroom at this time, aware urine specimen is needed.  

## 2015-06-22 NOTE — ED Notes (Signed)
Patient c/o left flank pain. Patient denies any dysuria or hematuria. Patient has a history of kidney stones.

## 2015-06-22 NOTE — Discharge Instructions (Signed)
CT scan today shows hydroureteronephrosis on the left a due to a 6.5 mm in diameter stone at the level of the mid pelvis. Approximately 8 nonobstructing renal calculi on the left, the largest measuring 4-5 mm. 2 mmnonobstructing stone on the right.  This is treated with fluids, pain control, and flomax.  Take percocet every four hours as needed.  Zofran for nausea every 6 hours as needed and flomax once daily.  Your labs also show mildly elevated lipase at 86.  This just needs to be followed by your primary care doctor.  Please call Dr. Mikle Bosworth office to follow up within the next 7 days.  Kidney Stones Kidney stones (urolithiasis) are deposits that form inside your kidneys. The intense pain is caused by the stone moving through the urinary tract. When the stone moves, the ureter goes into spasm around the stone. The stone is usually passed in the urine.  CAUSES   A disorder that makes certain neck glands produce too much parathyroid hormone (primary hyperparathyroidism).  A buildup of uric acid crystals, similar to gout in your joints.  Narrowing (stricture) of the ureter.  A kidney obstruction present at birth (congenital obstruction).  Previous surgery on the kidney or ureters.  Numerous kidney infections. SYMPTOMS   Feeling sick to your stomach (nauseous).  Throwing up (vomiting).  Blood in the urine (hematuria).  Pain that usually spreads (radiates) to the groin.  Frequency or urgency of urination. DIAGNOSIS   Taking a history and physical exam.  Blood or urine tests.  CT scan.  Occasionally, an examination of the inside of the urinary bladder (cystoscopy) is performed. TREATMENT   Observation.  Increasing your fluid intake.  Extracorporeal shock wave lithotripsy--This is a noninvasive procedure that uses shock waves to break up kidney stones.  Surgery may be needed if you have severe pain or persistent obstruction. There are various surgical procedures. Most of  the procedures are performed with the use of small instruments. Only small incisions are needed to accommodate these instruments, so recovery time is minimized. The size, location, and chemical composition are all important variables that will determine the proper choice of action for you. Talk to your health care provider to better understand your situation so that you will minimize the risk of injury to yourself and your kidney.  HOME CARE INSTRUCTIONS   Drink enough water and fluids to keep your urine clear or pale yellow. This will help you to pass the stone or stone fragments.  Strain all urine through the provided strainer. Keep all particulate matter and stones for your health care provider to see. The stone causing the pain may be as small as a grain of salt. It is very important to use the strainer each and every time you pass your urine. The collection of your stone will allow your health care provider to analyze it and verify that a stone has actually passed. The stone analysis will often identify what you can do to reduce the incidence of recurrences.  Only take over-the-counter or prescription medicines for pain, discomfort, or fever as directed by your health care provider.  Keep all follow-up visits as told by your health care provider. This is important.  Get follow-up X-rays if required. The absence of pain does not always mean that the stone has passed. It may have only stopped moving. If the urine remains completely obstructed, it can cause loss of kidney function or even complete destruction of the kidney. It is your responsibility to make  sure X-rays and follow-ups are completed. Ultrasounds of the kidney can show blockages and the status of the kidney. Ultrasounds are not associated with any radiation and can be performed easily in a matter of minutes.  Make changes to your daily diet as told by your health care provider. You may be told to:  Limit the amount of salt that you  eat.  Eat 5 or more servings of fruits and vegetables each day.  Limit the amount of meat, poultry, fish, and eggs that you eat.  Collect a 24-hour urine sample as told by your health care provider.You may need to collect another urine sample every 6-12 months. SEEK MEDICAL CARE IF:  You experience pain that is progressive and unresponsive to any pain medicine you have been prescribed. SEEK IMMEDIATE MEDICAL CARE IF:   Pain cannot be controlled with the prescribed medicine.  You have a fever or shaking chills.  The severity or intensity of pain increases over 18 hours and is not relieved by pain medicine.  You develop a new onset of abdominal pain.  You feel faint or pass out.  You are unable to urinate.   This information is not intended to replace advice given to you by your health care provider. Make sure you discuss any questions you have with your health care provider.   Document Released: 02/19/2005 Document Revised: 11/10/2014 Document Reviewed: 07/23/2012 Elsevier Interactive Patient Education [employer].

## 2015-06-30 DIAGNOSIS — N201 Calculus of ureter: Secondary | ICD-10-CM | POA: Diagnosis not present

## 2015-06-30 DIAGNOSIS — Z0000 Encounter for general adult medical examination without abnormal findings: Secondary | ICD-10-CM | POA: Diagnosis not present

## 2015-07-12 DIAGNOSIS — N200 Calculus of kidney: Secondary | ICD-10-CM | POA: Diagnosis not present

## 2015-07-12 DIAGNOSIS — Z0000 Encounter for general adult medical examination without abnormal findings: Secondary | ICD-10-CM | POA: Diagnosis not present

## 2015-08-03 DIAGNOSIS — N201 Calculus of ureter: Secondary | ICD-10-CM | POA: Diagnosis not present

## 2015-08-03 DIAGNOSIS — N200 Calculus of kidney: Secondary | ICD-10-CM | POA: Diagnosis not present

## 2015-08-03 DIAGNOSIS — Z0000 Encounter for general adult medical examination without abnormal findings: Secondary | ICD-10-CM | POA: Diagnosis not present

## 2015-08-04 ENCOUNTER — Other Ambulatory Visit: Payer: Self-pay | Admitting: Urology

## 2015-08-12 ENCOUNTER — Encounter (HOSPITAL_COMMUNITY): Payer: Self-pay | Admitting: *Deleted

## 2015-08-18 ENCOUNTER — Encounter (HOSPITAL_COMMUNITY): Payer: Self-pay | Admitting: General Practice

## 2015-08-18 ENCOUNTER — Encounter (HOSPITAL_COMMUNITY)
Admission: RE | Disposition: A | Discharge status: Home / Self Care | Payer: Self-pay | Source: Ambulatory Visit | Attending: Urology

## 2015-08-18 ENCOUNTER — Ambulatory Visit (HOSPITAL_COMMUNITY): Payer: Medicare Other

## 2015-08-18 ENCOUNTER — Ambulatory Visit (HOSPITAL_COMMUNITY)
Admission: RE | Admit: 2015-08-18 | Discharge: 2015-08-18 | Disposition: A | Discharge status: Home / Self Care | Payer: Medicare Other | Source: Ambulatory Visit | Attending: Urology | Admitting: Urology

## 2015-08-18 DIAGNOSIS — N201 Calculus of ureter: Secondary | ICD-10-CM | POA: Insufficient documentation

## 2015-08-18 DIAGNOSIS — Z79899 Other long term (current) drug therapy: Secondary | ICD-10-CM | POA: Diagnosis not present

## 2015-08-18 DIAGNOSIS — Z87442 Personal history of urinary calculi: Secondary | ICD-10-CM | POA: Insufficient documentation

## 2015-08-18 DIAGNOSIS — N132 Hydronephrosis with renal and ureteral calculous obstruction: Secondary | ICD-10-CM | POA: Diagnosis not present

## 2015-08-18 DIAGNOSIS — Z538 Procedure and treatment not carried out for other reasons: Secondary | ICD-10-CM | POA: Insufficient documentation

## 2015-08-18 SURGERY — LITHOTRIPSY, ESWL
Anesthesia: LOCAL | Laterality: Left

## 2015-08-18 MED ORDER — DIPHENHYDRAMINE HCL 25 MG PO CAPS
25.0000 mg | ORAL_CAPSULE | ORAL | Status: AC
  Administered 2015-08-18: 25 mg via ORAL
  Filled 2015-08-18: qty 1

## 2015-08-18 MED ORDER — SODIUM CHLORIDE 0.9 % IV SOLN
INTRAVENOUS | Status: DC
  Administered 2015-08-18: 07:00:00 via INTRAVENOUS

## 2015-08-18 MED ORDER — CIPROFLOXACIN HCL 500 MG PO TABS
500.0000 mg | ORAL_TABLET | ORAL | Status: AC
Start: 2015-08-18 — End: 2015-08-18
  Administered 2015-08-18: 500 mg via ORAL
  Filled 2015-08-18: qty 1

## 2015-08-18 MED ORDER — DIAZEPAM 5 MG PO TABS
10.0000 mg | ORAL_TABLET | ORAL | Status: AC
  Administered 2015-08-18: 10 mg via ORAL
  Filled 2015-08-18: qty 2

## 2015-08-18 NOTE — Progress Notes (Signed)
Procedure cancelled per Dr. Gaynelle Arabian, pt's stone in bladder, pt. To continue to strain urine and to follow-up with the office, pt. And pt's son verbalized understanding.

## 2015-08-18 NOTE — Progress Notes (Signed)
Lithotripsy Lucianne Lei unable to see stone, pt. Taken to Dr. Arlyn Leak office for CT.

## 2015-08-18 NOTE — Progress Notes (Signed)
Pt. Back from Dr. Arlyn Leak office, pt. Waiting on results of CT, pt's son at side.

## 2015-09-01 DIAGNOSIS — N200 Calculus of kidney: Secondary | ICD-10-CM | POA: Diagnosis not present

## 2015-10-10 DIAGNOSIS — H524 Presbyopia: Secondary | ICD-10-CM | POA: Diagnosis not present

## 2015-10-10 DIAGNOSIS — H25813 Combined forms of age-related cataract, bilateral: Secondary | ICD-10-CM | POA: Diagnosis not present

## 2015-10-10 DIAGNOSIS — H01001 Unspecified blepharitis right upper eyelid: Secondary | ICD-10-CM | POA: Diagnosis not present

## 2015-10-10 DIAGNOSIS — H43812 Vitreous degeneration, left eye: Secondary | ICD-10-CM | POA: Diagnosis not present

## 2017-02-20 DIAGNOSIS — C9000 Multiple myeloma not having achieved remission: Secondary | ICD-10-CM

## 2017-02-21 ENCOUNTER — Ambulatory Visit: Payer: PRIVATE HEALTH INSURANCE | Attending: Hematology & Oncology

## 2017-02-21 DIAGNOSIS — I2510 Atherosclerotic heart disease of native coronary artery without angina pectoris: Secondary | ICD-10-CM

## 2017-02-21 NOTE — Patient Instructions
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 Dory Peru

## 2017-02-21 NOTE — Progress Notes
Outpatient Hematology/Oncology Progess Note    Patient name:  Jonathan Mosley  MRN:  [id number]  DOB:  [birth year]  CSN: [id number]  Date of encounter: 02/21/2017  Provider: Guinevere Ferrari, MD    Reason for appointment: F/u MM  Underlying disease: IgG Kappa Multiple Myeloma, s/p  autologous stem cell transplant  Outpatient hematologist: Dr. Cyril Mourning  Conditioning regimen: Melphalan 3/14 +3/15  Transplant date: 05/20/2014    Subjective:  He continues on ixazomib, Revlimid and dexamethasone.    Reports worsening neuropathy. Diminished sensation to ankle. Mild parasthesias in hands. Decreased taste. Progressed further up ankles since last visit. Larey Seat once 2 months ago. Started on lyrica without improvement by PCP (Dr. Raynelle Jan, [place], outside [place]). Doesn't believe he's had any other work-up.    Burned his finger via a shock transmitted through his ring. Healing. Did take 1 month from from ninlaro and revlimid after the injury, then resumed.     ECOG performance status: 0    Objective:  Current medications:  Outpatient Medications Prior to Visit   Medication Sig   ??? ammonium lactate 12% lotion APPLY AND RUB IN A THIN FILM TO AFFECTED AREAS TWICE DAILY.(AM AND PM).   ??? ANORO ELLIPTA 62.5-25 MCG/INH inhaler Inhale 1 puff daily .     ??? CIALIS 20 MG tablet TAKE 1 TABLET AS NEEDED AS NEEDED   ??? dexamethasone 4 mg tablet TAKE 10 TABLETS WEEKLY   ??? fluoxetine 20 mg capsule Take 20 mg by mouth daily.   ??? ranitidine 150 mg tablet    ??? acyclovir 400 mg tablet Take 1 tablet (400 mg total) by mouth two (2) times daily. (Patient not taking: Reported on 02/21/2017.)   ??? oxycodone 10 mg tablet Take 1 tablet (10 mg total) by mouth every six (6) hours as needed for Moderate Pain or Severe Pain.  Max Daily Amount: 40 mg (Patient not taking: Reported on 02/20/2016.)   ??? oxycodone 5 mg tablet Take 2 tablets (10 mg total) by mouth every six (6) hours as needed for mucositis pain. (Patient not taking: Reported on 02/20/2016.) ??? prednisone 10 mg tablet      No facility-administered medications prior to visit.      Lasix   Vitamin D  Lyrica     Documented in clinic chart.    Allergies:  No Known Allergies  Documented in clinic chart.    Review of Systems:  Constitutional: No fevers, chills, weight loss or appetite changes.   Eyes: No recent blurry vision, double vision or visual changes.  ENT: No recent sinus pain or congestion. No sore throat or mouth sores.   Neck: No neck pain or stiffness.  Cardiovascular: No chest pain or pressure, no palpitations.   Pulmonary: No shortness of breath, no cough or wheezing.   Gastrointestinal: No abdominal pain, nausea, vomiting or diarrhea.  No melena or BRBPR.   Genitourinary: No urinary frequency, urgency, or dysuria.   Musculoskeletal: No joint pain, muscle pain or back pain.   Neurologic: No headaches. No numbness, tingling or weakness.   Dermatologic: No rash, pruritus or recent skin changes.   Hematologic: No abnormal bruising or bleeding.   Endocrine: No polyuria, polydipsia, heat or cold intolerance.   Psychiatric: No depressive symptoms, no suicidal or homicidal ideation.     Vitals:  BP 139/68  ~ Pulse 102  ~ Temp 36 ???C (96.8 ???F) (Oral)  ~ Resp 18  ~ Ht 5' 10'' (1.778 m)  ~ Wt 202 lb (  91.6 kg)  ~ BMI 28.98 kg/m???   Reviewed and documented in clinic chart.    Physical Exam:  General: Appears well-developed, well-nourished and close to stated age.   Head: Normocephalic, atraumatic.  Eyes: PERRL without icterus.   ENT: Oropharynx is clear, mucus membranes are moist.  No oral ulcers noted. Good dentition.  Neck: Supple. Trachea midline.   CV: Regular in rate and rhythm, no murmurs or gallops.   Chest: Clear to auscultation bilaterally without wheezing or rhonchi. No crackles noted. Respiratory effort appears normal.   Abdomen: Obese. Soft, nontender and nondistended. Bowel sounds are present and normoactive. No organomegaly is appreciated Musculoskeletal: No edema. No cyanosis. Extremities are warm and well-perfused.   Neurologic: Gait appears normal. CN II-XII intact. Diminished light touch sensation feet to mid-shin bilaterally, proprioception intact b/l toes. Oriented to person, place and time.   Hematologic: Scattered ecchymosis on right arm  Dermatologic: No rashes appreciated.   Lymphatic: No palpable cervical, supraclavicular, adenopathy appreciated.   Psychiatric: Affect appropriate.  Pleasant and conversant.     Recent Labs:  Lab Visit on 02/21/2017   Component Date Value Ref Range Status   ??? Sodium 02/21/2017 144  135 - 146 mmol/L Final   ??? Potassium 02/21/2017 3.9  3.6 - 5.3 mmol/L Final   ??? Chloride 02/21/2017 104  96 - 106 mmol/L Final   ??? Total CO2 02/21/2017 26  20 - 30 mmol/L Final   ??? Anion Gap 02/21/2017 14  8 - 19 Final   ??? Glucose 02/21/2017 92  65 - 99 mg/dL Final   ??? GFR Estimate for Non-African Ameri* 02/21/2017 64  See GFR Additional Information Final   ??? GFR Estimate for African American 02/21/2017 74  See GFR Additional Information Final   ??? GFR Additional Information 02/21/2017 See Comment   Final    GFR >89        Normal  GFR 60 - 89    Normal to mildly decreased  GFR 45 - 59    Mildly to moderately decreased  GFR 30 - 44    Moderately to severely decreased  GFR 15 - 29    Severely decreased  GFR <15        Kidney failure  The CKD-EPI equation was used to estimate GFR and assumes stable creatinine concentrations.  Results are in mL/min/1.73 square meters.   ??? Creatinine 02/21/2017 1.14  0.60 - 1.30 mg/dL Final   ??? Urea Nitrogen 02/21/2017 15  7 - 22 mg/dL Final   ??? Calcium 03/07/7251 8.8  8.6 - 10.4 mg/dL Final   ??? Total Protein 02/21/2017 6.4  6.1 - 8.2 g/dL Final   ??? Albumin 66/44/0347 4.3  3.9 - 5.0 g/dL Final   ??? Bilirubin,Total 02/21/2017 0.4  0.1 - 1.2 mg/dL Final   ??? Alkaline Phosphatase 02/21/2017 70  37 - 113 U/L Final   ??? Aspartate Aminotransferase 02/21/2017 16  13 - 47 U/L Final ??? Alanine Aminotransferase 02/21/2017 24  8 - 64 U/L Final   ??? White Blood Cell Count 02/21/2017 4.98  4.16 - 9.95 x10E3/uL Final   ??? Red Blood Cell Count 02/21/2017 4.41  4.41 - 5.95 x10E6/uL Final   ??? Hemoglobin 02/21/2017 12.4* 13.5 - 17.1 g/dL Final   ??? Hematocrit 02/21/2017 38.5  38.5 - 52.0 % Final   ??? Mean Corpuscular Volume 02/21/2017 87.3  79.3 - 98.6 fL Final   ??? Mean Corpuscular Hemoglobin 02/21/2017 28.1  26.4 - 33.4 pg Final   ???  MCH Concentration 02/21/2017 32.2  31.5 - 35.5 g/dL Final   ??? Red Cell Distribution Width-SD 02/21/2017 49.2* 36.9 - 48.3 fL Final   ??? Red Cell Distribution Width-CV 02/21/2017 15.5  11.1 - 15.5 % Final   ??? Platelet Count, Auto 02/21/2017 78* 143 - 398 x10E3/uL Final   ??? Mean Platelet Volume 02/21/2017 9.9  9.3 - 13.0 fL Final   ??? Nucleated RBC%, automated 02/21/2017 0.0  No Ref. Range % Final    Percent Reference Range Not Reported per accrediting agency   ??? Absolute Nucleated RBC Count 02/21/2017 0.00  0.00 - 0.00 x10E3/uL Final   ??? Neutrophil Abs (Prelim) 02/21/2017 3.48  See Absolute Neut Ct. x10E3/uL Final      This is a preliminary result.  If automated differential see Absolute Neut  Count or if manual differential see Absolute Neut Ct, Manual for final result.   ??? Neutrophil Percent, Auto 02/21/2017 69.8  No Ref. Range % Final    Percent reference range not reported per accrediting agency.     ??? Lymphocyte Percent, Auto 02/21/2017 14.5  No Ref. Range % Final    Percent reference range not reported per accrediting agency.     ??? Monocyte Percent, Auto 02/21/2017 13.9  No Ref. Range % Final    Percent reference range not reported per accrediting agency.     ??? Eosinophil Percent, Auto 02/21/2017 0.8  No Ref. Range % Final    Percent reference range not reported per accrediting agency.     ??? Basophil Percent, Auto 02/21/2017 0.4  No Ref. Range % Final    Percent reference range not reported per accrediting agency. ??? Immature Granulocytes% 02/21/2017 0.6  No Reference Range % Final   ??? Absolute Neut Count 02/21/2017 3.48  1.80 - 6.90 x10E3/uL Final   ??? Absolute Lymphocyte Count 02/21/2017 0.72* 1.30 - 3.40 x10E3/uL Final   ??? Absolute Mono Count 02/21/2017 0.69  0.20 - 0.80 x10E3/uL Final   ??? Absolute Eos Count 02/21/2017 0.04  0.00 - 0.50 x10E3/uL Final   ??? Absolute Baso Count 02/21/2017 0.02  0.00 - 0.10 x10E3/uL Final   ??? Absolute Immature Gran Count 02/21/2017 0.03  0.00 - 0.04 x10E3/uL Final   ??? Total Protein. Serum 02/21/2017 6.6  6.1 - 8.2 g/dL Final     (Additional labs performed in clinic, and reviewed and recorded in clinic chart).    Pertinent imaging:  None recent.    Pertinent pathology:  None recent.    Impression and Recommendations:  Andriy Sherk is a 71 y.o. male  Hematological/oncological management issues are as follows:    # IgG Kappa Multiple Myeloma s/p autologous stem cell transplant.  His response following KRd was a VGPR prior to transplant and his day +100 evaluation showed the same.     - Conditioning: Melphalan 100mg /m2 x 2   - Myeloma evaluation was done today, labs pending  - He will continue dexamethasone  - STOP lenalidomide and ixazomib for possible contribution to neuropathy.  - Continue monthly lab monitoring with outside oncologist Dr. Daune Perch  ???  # Parethesias/peripheral neuropathy. Stocking glove pattern. Diminished up to ankle, parasthesias in hands. Proprioception intact. One fall  - Revlimid and ixazomib have a low incidence of neuropathy, so he should undergo a further evaluation as the cause of neuropathy with PCP Dr. Raynelle Jan  - As above hold revlimid and ixazomib for possible contribution to neuropathy  ???  # Infectious prophylaxis  - cont Acyclovir  while on a proteosome inhibitor  ???  # Pancytopenia secondary to high-dose chemotherapy:   Counts recovered. Mild cytopenias likely due to treatment.  ???  # COPD  - Home regimen: Anoro Ellipta 62.5-25 mcg/inh 1 puff daily is non-formulary, pt doesn't have with him  - PFTs with mild obstructive defect, DLCO WNL  ???  # BPH  - Terazosin    #Peripheral edema  - Lasix 40 po qd    RTC in 1 year    Patient seen, examined, and plan formulated/discussed with attending Guinevere Ferrari, MD.  Author: Nickie Retort. Hoesterey, MD     Attending Addendum: I have seen and examined the patient with the house staff and agree with the findings as described above. All laboratory, pathological, and radiological data were reviewed by me. The plan was formulated together with the house staff and is detailed in the note above.

## 2017-06-11 IMAGING — CT CT RENAL STONE PROTOCOL
2 of 3 series · 16 of 46 positions shown, 18 images · non-contrast
Comparison: 08/06/2008

CLINICAL DATA: Left flank pain. History of renal calculi
previously.

EXAM:
CT ABDOMEN AND PELVIS WITHOUT CONTRAST
TECHNIQUE: Multidetector CT imaging of the abdomen and pelvis was performed
following the standard protocol without IV contrast.

[Series 4: lung · axial · 0.81mm/px · z∈[+1345,+1491]mm · 13 of 85 slices shown, 15 images]
[im 6/85  soft-tissue]
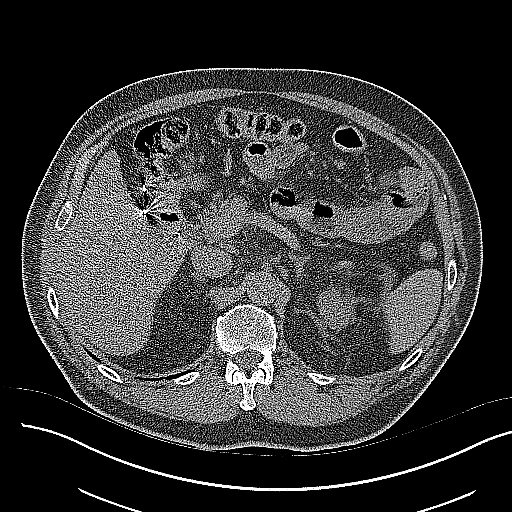
[im 6/85  bone]
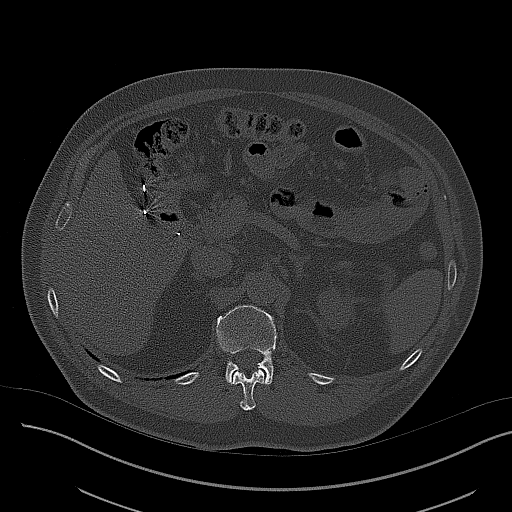
[im 11/85  soft-tissue]
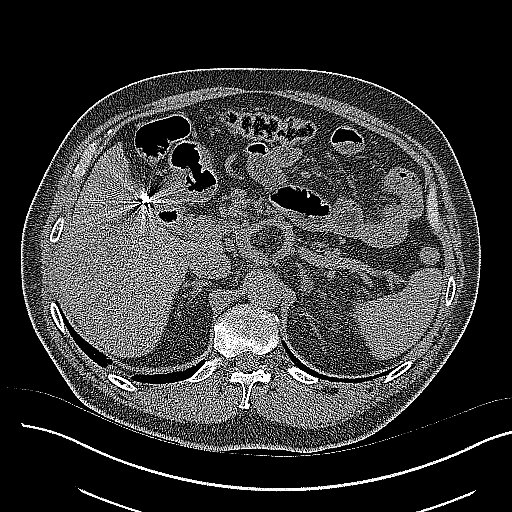
[im 17/85  soft-tissue]
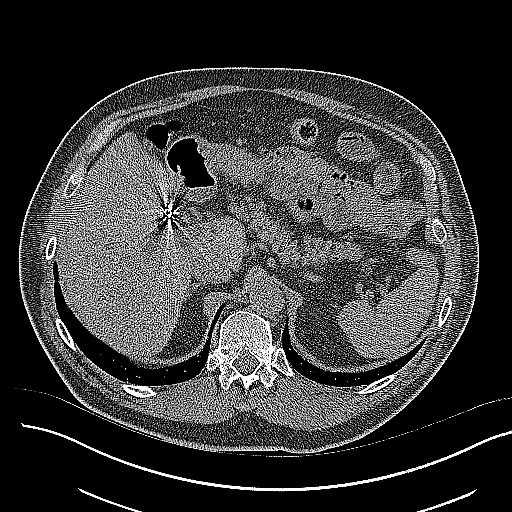
[im 25/85  soft-tissue]
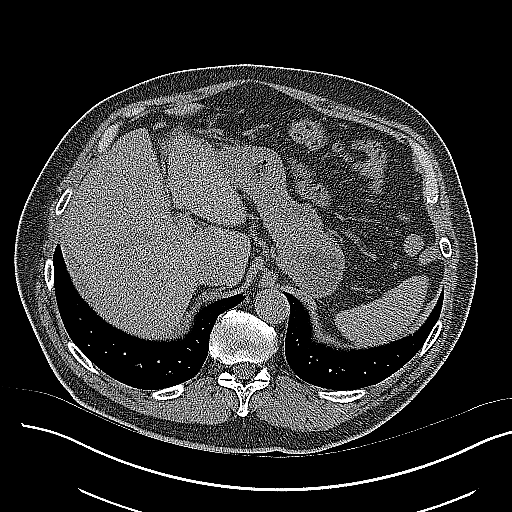
[im 30/85  soft-tissue]
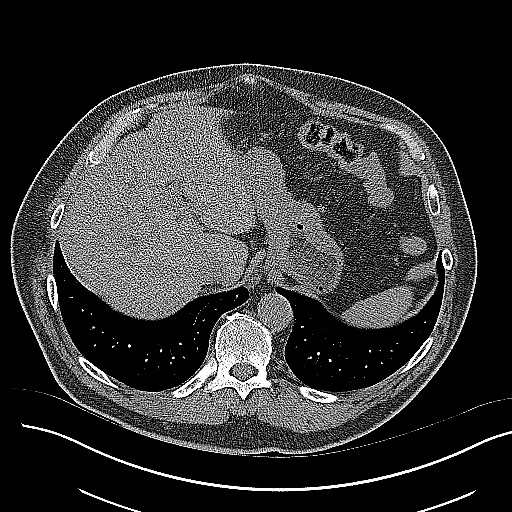
[im 36/85  soft-tissue]
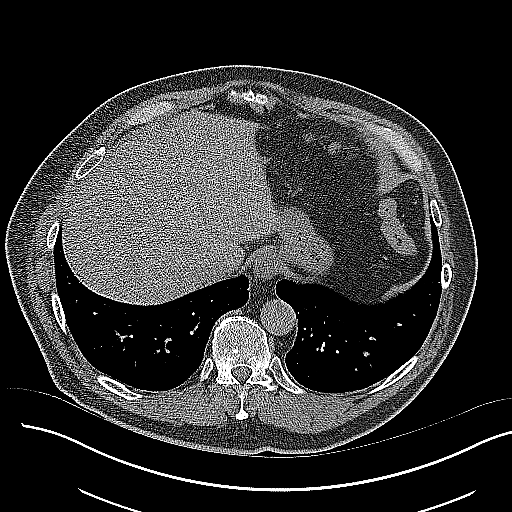
[im 44/85  soft-tissue]
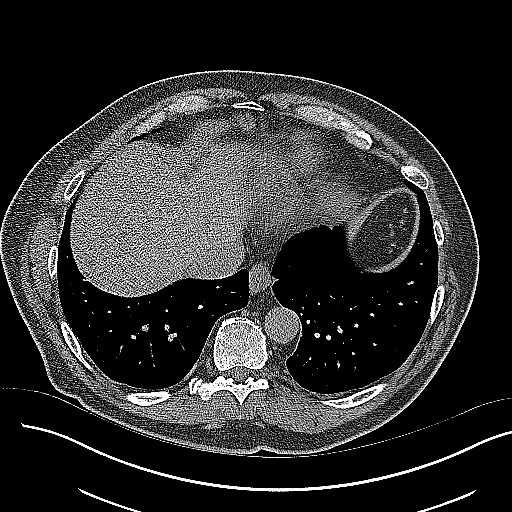
[im 49/85  soft-tissue]
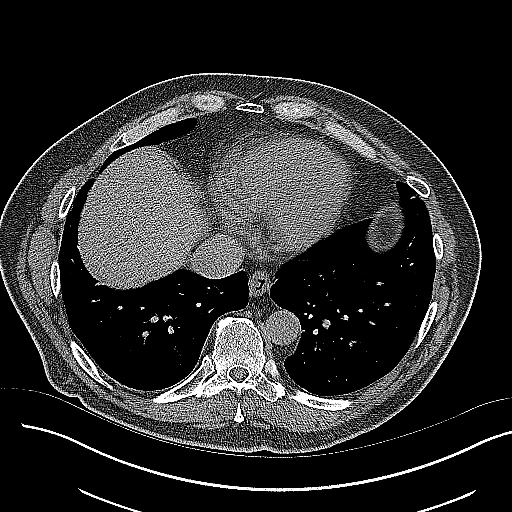
[im 55/85  soft-tissue]
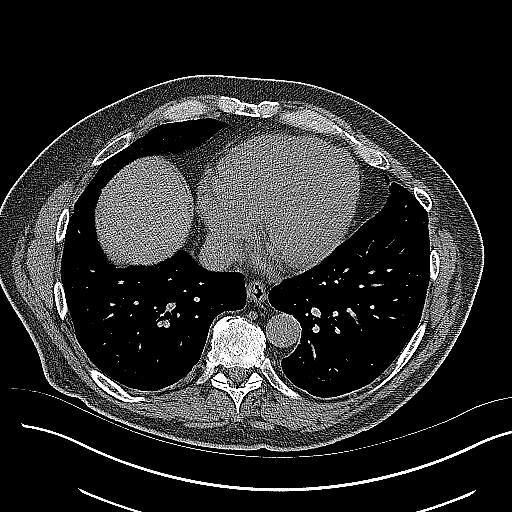
[im 55/85  bone]
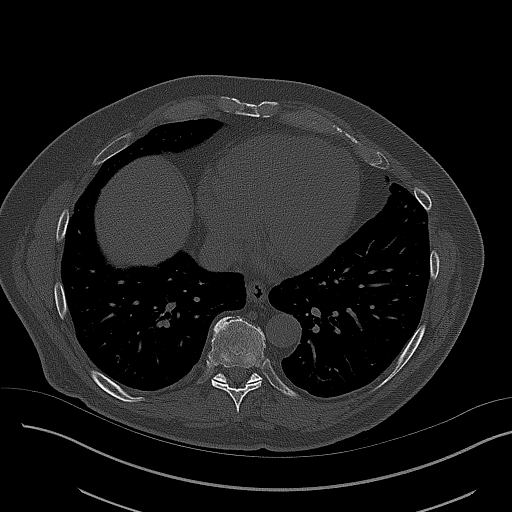
[im 60/85  soft-tissue]
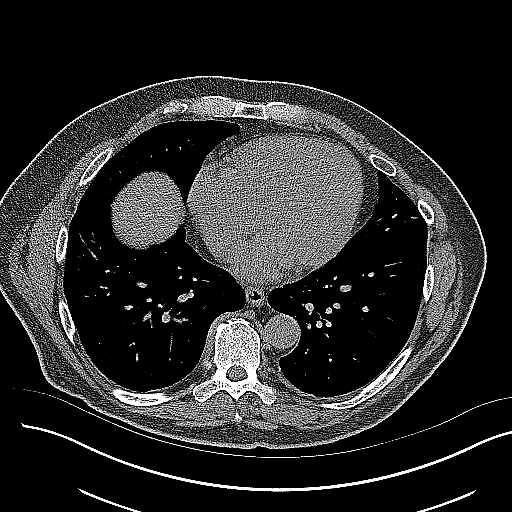
[im 68/85  soft-tissue]
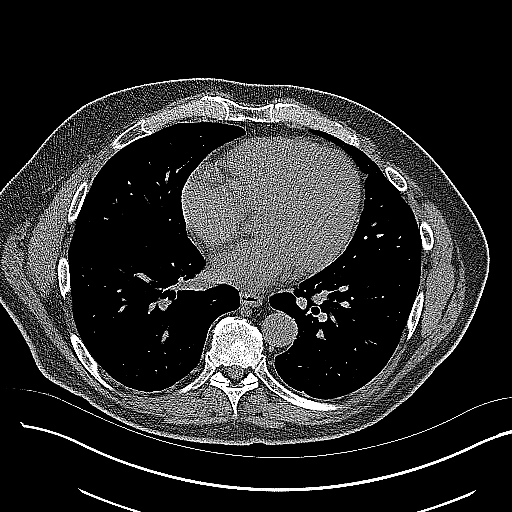
[im 74/85  soft-tissue]
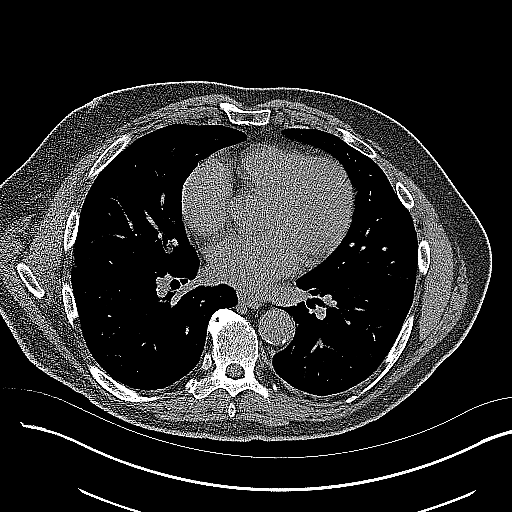
[im 79/85  soft-tissue]
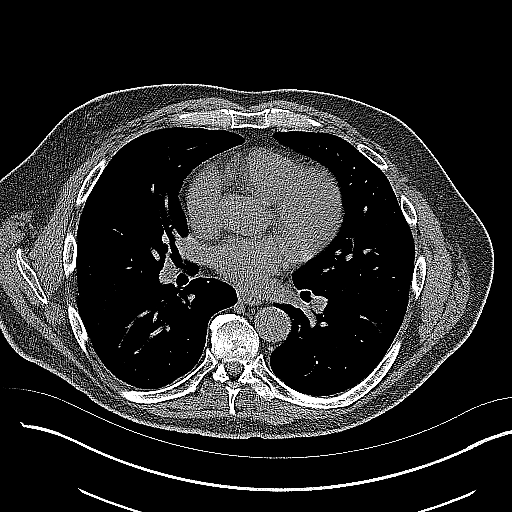

[Series 5: coronal · coronal · 0.75mm/px · 3 of 156 slices shown]
[im 52/156  soft-tissue]
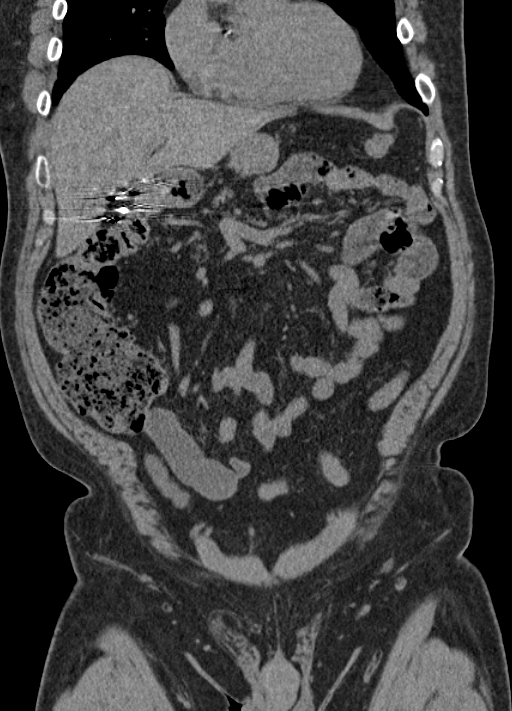
[im 69/156  soft-tissue]
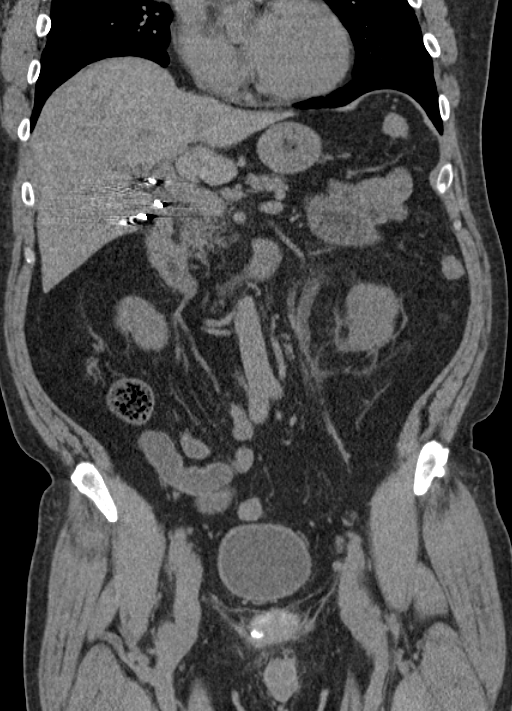
[im 87/156  soft-tissue]
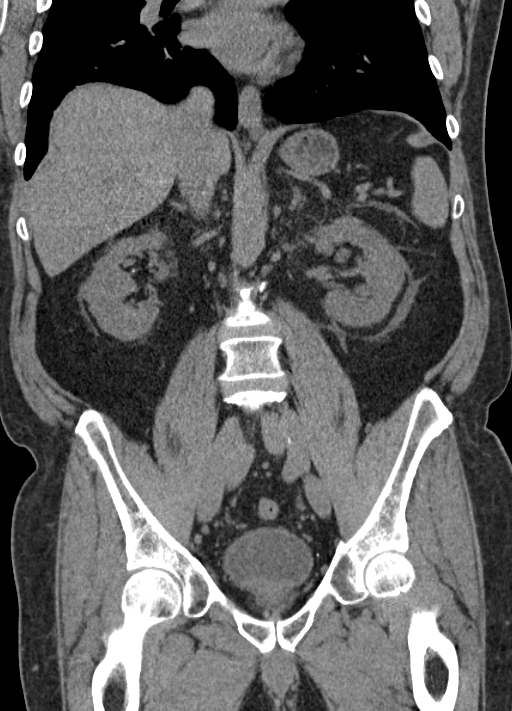

[16 of 46 positions shown; findings below may reference images not displayed]

FINDINGS: Lung bases are clear except for mild scarring. There is extensive
coronary artery calcification.

The liver has a normal appearance without contrast. Previous
cholecystectomy. The spleen is normal. The pancreas is normal. The
adrenal glands are normal. The right kidney is normal except for a
nonobstructing 2 mm stone in the midportion. The left kidney shows
mild swelling with. Nephric edema. There are several small renal
calculi, approximately 8 in number with the largest stones in the
lower pole and upper pole measuring 4-5 mm. There is
hydroureteronephrosis with the left ureter being dilated to the
level of the mid pelvis where there is obstruction secondary to a 80
6.5 mm in diameter stone. No stone in the bladder. Multiple
phleboliths are present in the pelvis. The prostate is mildly
enlarged.

The aorta shows mild atherosclerosis but no aneurysm. The IVC is
normal. No bowel pathology is seen. No significant bony finding.
IMPRESSION: Hydroureteronephrosis on the left a due to a 6.5 mm in diameter
stone at the level of the mid pelvis. Approximately 8 nonobstructing
renal calculi on the left, the largest measuring 4-5 mm. 2 mm
nonobstructing stone on the right.

Advanced coronary artery calcification.

Previous cholecystectomy.

## 2017-10-19 DIAGNOSIS — S76311A Strain of muscle, fascia and tendon of the posterior muscle group at thigh level, right thigh, initial encounter: Secondary | ICD-10-CM | POA: Diagnosis not present

## 2018-01-01 DIAGNOSIS — E669 Obesity, unspecified: Secondary | ICD-10-CM | POA: Diagnosis not present

## 2018-01-01 DIAGNOSIS — Z0001 Encounter for general adult medical examination with abnormal findings: Secondary | ICD-10-CM | POA: Diagnosis not present

## 2018-01-01 DIAGNOSIS — Z79899 Other long term (current) drug therapy: Secondary | ICD-10-CM | POA: Diagnosis not present

## 2018-01-01 DIAGNOSIS — Z1389 Encounter for screening for other disorder: Secondary | ICD-10-CM | POA: Diagnosis not present

## 2018-01-01 DIAGNOSIS — I10 Essential (primary) hypertension: Secondary | ICD-10-CM | POA: Diagnosis not present

## 2018-01-01 DIAGNOSIS — M545 Low back pain: Secondary | ICD-10-CM | POA: Diagnosis not present

## 2018-01-01 DIAGNOSIS — E785 Hyperlipidemia, unspecified: Secondary | ICD-10-CM | POA: Diagnosis not present

## 2018-01-01 DIAGNOSIS — E291 Testicular hypofunction: Secondary | ICD-10-CM | POA: Diagnosis not present

## 2018-01-01 DIAGNOSIS — E559 Vitamin D deficiency, unspecified: Secondary | ICD-10-CM | POA: Diagnosis not present

## 2018-01-01 DIAGNOSIS — Z125 Encounter for screening for malignant neoplasm of prostate: Secondary | ICD-10-CM | POA: Diagnosis not present

## 2018-01-01 DIAGNOSIS — I447 Left bundle-branch block, unspecified: Secondary | ICD-10-CM | POA: Diagnosis not present

## 2018-02-16 DIAGNOSIS — C9000 Multiple myeloma not having achieved remission: Secondary | ICD-10-CM

## 2018-02-17 ENCOUNTER — Telehealth: Payer: PRIVATE HEALTH INSURANCE

## 2018-02-17 ENCOUNTER — Ambulatory Visit: Payer: PRIVATE HEALTH INSURANCE | Attending: Hematology & Oncology

## 2018-02-17 DIAGNOSIS — D630 Anemia in neoplastic disease: Secondary | ICD-10-CM

## 2018-02-17 DIAGNOSIS — J438 Other emphysema: Secondary | ICD-10-CM

## 2018-02-17 NOTE — Patient Instructions
Stop ninlaro

## 2018-02-17 NOTE — Telephone Encounter
Please req auth for bd/rv on 02-16-19

## 2018-02-17 NOTE — Progress Notes
Outpatient Hematology/Oncology Progess Note    Patient name:  Jonathan Mosley  MRN:  [id number]  DOB:  [birth year]  CSN: [id number]  Date of encounter: 02/17/2018  Provider: Guinevere Ferrari, MD    Reason for appointment: F/u MM  Underlying disease: IgG Kappa Multiple Myeloma, s/p  autologous stem cell transplant  Outpatient hematologist: Dr. Cyril Mourning  Conditioning regimen: Melphalan 3/14 +3/15  Transplant date: 05/20/2014    Subjective:      Reports worsening neuropathy. He has continued on revlimid and ninlaro.  The neuropathy is predominantly in his feet. He had another fall.  Decreased taste.Nothing has helped the pain related to neuropathy including neurontin and lyrica. He is walking with a cane.  His wife had a stroke last summer.    ECOG performance status: 2    Objective:  Current medications:  Outpatient Medications Prior to Visit   Medication Sig   ??? ammonium lactate 12% lotion APPLY AND RUB IN A THIN FILM TO AFFECTED AREAS TWICE DAILY.(AM AND PM).   ??? ANORO ELLIPTA 62.5-25 MCG/INH inhaler Inhale 1 puff daily .     ??? CIALIS 20 MG tablet TAKE 1 TABLET AS NEEDED AS NEEDED   ??? dexamethasone 4 mg tablet TAKE 10 TABLETS WEEKLY   ??? fluoxetine 20 mg capsule Take 20 mg by mouth daily.   ??? prednisone 10 mg tablet    ??? ranitidine 150 mg tablet    ??? acyclovir 400 mg tablet Take 1 tablet (400 mg total) by mouth two (2) times daily. (Patient not taking: Reported on 02/21/2017.)   ??? amLODIPine 5 mg tablet TAKE 1 TABLET BY MOUTH EVERY DAY AS DIRECTED   ??? oxycodone 10 mg tablet Take 1 tablet (10 mg total) by mouth every six (6) hours as needed for Moderate Pain or Severe Pain.  Max Daily Amount: 40 mg (Patient not taking: Reported on 02/20/2016.)   ??? oxycodone 5 mg tablet Take 2 tablets (10 mg total) by mouth every six (6) hours as needed for mucositis pain. (Patient not taking: Reported on 02/20/2016.)     No facility-administered medications prior to visit.      Lasix   Vitamin D  Lyrica     Documented in clinic chart. Allergies:  No Known Allergies  Documented in clinic chart.    Review of Systems:  A complete review of systems was obtained and was non-contributory unless noted above.    Vitals:  BP 135/79 Comment: vitals taken ahp ~ Pulse (!) 110  ~ Temp 36.4 ???C (97.6 ???F) (Oral)  ~ Wt 97.5 kg (215 lb)  ~ SpO2 94%  ~ BMI 30.85 kg/m???   Reviewed and documented in clinic chart.    Physical Exam:  General: Appears well-developed, well-nourished and close to stated age.   Head: Normocephalic, atraumatic.  Eyes: PERRL without icterus.   ENT: Oropharynx is clear, mucus membranes are moist.  No oral ulcers noted.   Neck: Supple. Trachea midline.   CV: Regular in rate and rhythm, no murmurs or gallops.   Chest: Clear to auscultation bilaterally without wheezing or rhonchi. No crackles noted. Respiratory effort appears normal.   Abdomen: Obese. Soft, nontender and nondistended. Bowel sounds are present and normoactive. No organomegaly is appreciated  Musculoskeletal: No edema. No cyanosis. Extremities are warm and well-perfused.   Neurologic: Gait appears normal. CN II-XII intact. Diminished light touch sensation feet to mid-shin bilaterally, proprioception intact b/l toes. Oriented to person, place and time.   Hematologic: Scattered ecchymosis on right  arm  Dermatologic: No rashes appreciated.   Lymphatic: No palpable cervical, supraclavicular, adenopathy appreciated.   Psychiatric: Affect appropriate.  Pleasant and conversant.     Recent Labs:  Lab Visit on 02/17/2018   Component Date Value Ref Range Status   ??? Sodium 02/17/2018 136  135 - 146 mmol/L Final   ??? Potassium 02/17/2018 4.2  3.6 - 5.3 mmol/L Final   ??? Chloride 02/17/2018 101  96 - 106 mmol/L Final   ??? Total CO2 02/17/2018 20  20 - 30 mmol/L Final   ??? Anion Gap 02/17/2018 15  8 - 19 mmol/L Final   ??? Glucose 02/17/2018 185* 65 - 99 mg/dL Final   ??? GFR Estimate for Non-African Ameri* 02/17/2018 83  See GFR Additional Information mL/min/1.72m2 Final ??? GFR Estimate for African American 02/17/2018 >89  See GFR Additional Information mL/min/1.74m2 Final   ??? GFR Additional Information 02/17/2018 See Comment   Final    GFR >89        Normal  GFR 60 - 89    Normal to mildly decreased  GFR 45 - 59    Mildly to moderately decreased  GFR 30 - 44    Moderately to severely decreased  GFR 15 - 29    Severely decreased  GFR <15        Kidney failure  The CKD-EPI equation was used to estimate GFR and assumes stable creatinine concentrations.  Results are in mL/min/1.73 square meters.   ??? Creatinine 02/17/2018 0.92  0.60 - 1.30 mg/dL Final   ??? Urea Nitrogen 02/17/2018 16  7 - 22 mg/dL Final   ??? Calcium 16/12/9602 8.9  8.6 - 10.4 mg/dL Final   ??? Total Protein 02/17/2018 8.5* 6.1 - 8.2 g/dL Final   ??? Albumin 54/11/8117 4.6  3.9 - 5.0 g/dL Final   ??? Bilirubin,Total 02/17/2018 0.4  0.1 - 1.2 mg/dL Final   ??? Alkaline Phosphatase 02/17/2018 78  37 - 113 U/L Final   ??? Aspartate Aminotransferase 02/17/2018 21  13 - 47 U/L Final   ??? Alanine Aminotransferase 02/17/2018 29  8 - 64 U/L Final   ??? White Blood Cell Count 02/17/2018 4.55  4.16 - 9.95 x10E3/uL Final   ??? Red Blood Cell Count 02/17/2018 4.43  4.41 - 5.95 x10E6/uL Final   ??? Hemoglobin 02/17/2018 12.4* 13.5 - 17.1 g/dL Final   ??? Hematocrit 02/17/2018 38.0* 38.5 - 52.0 % Final   ??? Mean Corpuscular Volume 02/17/2018 85.8  79.3 - 98.6 fL Final   ??? Mean Corpuscular Hemoglobin 02/17/2018 28.0  26.4 - 33.4 pg Final   ??? MCH Concentration 02/17/2018 32.6  31.5 - 35.5 g/dL Final   ??? Red Cell Distribution Width-SD 02/17/2018 46.9  36.9 - 48.3 fL Final   ??? Red Cell Distribution Width-CV 02/17/2018 15.0  11.1 - 15.5 % Final   ??? Platelet Count, Auto 02/17/2018 130* 143 - 398 x10E3/uL Final   ??? Mean Platelet Volume 02/17/2018 10.1  9.3 - 13.0 fL Final   ??? Nucleated RBC%, automated 02/17/2018 0.0  No Ref. Range % Final    Percent Reference Range Not Reported per accrediting agency ??? Absolute Nucleated RBC Count 02/17/2018 0.00  0.00 - 0.00 x10E3/uL Final   ??? Neutrophil Abs (Prelim) 02/17/2018 3.39  See Absolute Neut Ct. x10E3/uL Final    This is a preliminary result.  If automated differential see Absolute Neut  Count or if manual differential see Absolute Neut Ct, Manual for final result.   ??? Neutrophil  Percent, Auto 02/17/2018 74.5  No Ref. Range % Final    Percent reference range not reported per accrediting agency.  Immature granulocytes seen Validated by manual slide review   ??? Lymphocyte Percent, Auto 02/17/2018 23.1  No Ref. Range % Final    Percent reference range not reported per accrediting agency.     ??? Monocyte Percent, Auto 02/17/2018 1.1  No Ref. Range % Final    Percent reference range not reported per accrediting agency.     ??? Eosinophil Percent, Auto 02/17/2018 0.2  No Ref. Range % Final    Percent reference range not reported per accrediting agency.     ??? Basophil Percent, Auto 02/17/2018 0.0  No Ref. Range % Final    Percent reference range not reported per accrediting agency.     ??? Immature Granulocytes% 02/17/2018 1.1  No Reference Range % Final   ??? Absolute Neut Count 02/17/2018 3.39  1.80 - 6.90 x10E3/uL Final   ??? Absolute Lymphocyte Count 02/17/2018 1.05* 1.30 - 3.40 x10E3/uL Final   ??? Absolute Mono Count 02/17/2018 0.05* 0.20 - 0.80 x10E3/uL Final   ??? Absolute Eos Count 02/17/2018 0.01  0.00 - 0.50 x10E3/uL Final   ??? Absolute Baso Count 02/17/2018 0.00  0.00 - 0.10 x10E3/uL Final   ??? Absolute Immature Gran Count 02/17/2018 0.05* 0.00 - 0.04 x10E3/uL Final   ??? Total Protein. Serum 02/17/2018 8.2  6.1 - 8.2 g/dL Final     (Additional labs performed in clinic, and reviewed and recorded in clinic chart).    Pertinent imaging:  None recent.    Pertinent pathology:  None recent.    Impression and Recommendations:  Elchanan [name] is a 72 y.o. male  Hematological/oncological management issues are as follows: # IgG Kappa Multiple Myeloma s/p autologous stem cell transplant.  His response following KRd was a VGPR prior to transplant and his day +100 evaluation showed the same.     - Conditioning: Melphalan 100mg /m2 x 2   - Myeloma evaluation showed progressive disease.  I left a message for Dr. Hendricks Limes.  The patient should start daratumumab, pomalidomide 2mg  and dexamethasone.  ???  # Parethesias/peripheral neuropathy. Stocking glove pattern. Diminished up to ankle, parasthesias in hands. Proprioception intact.  ???  # Infectious prophylaxis  - cont Acyclovir while on a proteosome inhibitor  ???  # Pancytopenia secondary to high-dose chemotherapy:   Counts recovered. Mild cytopenias likely due to treatment.  ???  # COPD  - Home regimen: Anoro Ellipta 62.5-25 mcg/inh 1 puff daily is non-formulary, pt doesn't have with him  - PFTs with mild obstructive defect, DLCO WNL  ???  # BPH  - Terazosin

## 2019-02-06 DIAGNOSIS — C9000 Multiple myeloma not having achieved remission: Secondary | ICD-10-CM

## 2019-02-16 ENCOUNTER — Ambulatory Visit: Payer: PRIVATE HEALTH INSURANCE

## 2019-02-16 ENCOUNTER — Ambulatory Visit: Payer: PRIVATE HEALTH INSURANCE | Attending: Hematology & Oncology

## 2019-02-16 DIAGNOSIS — I2510 Atherosclerotic heart disease of native coronary artery without angina pectoris: Secondary | ICD-10-CM

## 2019-02-16 DIAGNOSIS — D630 Anemia in neoplastic disease: Secondary | ICD-10-CM

## 2019-02-16 NOTE — Progress Notes
Outpatient Hematology/Oncology Progess Note    Patient name:  Mickeal Daws  MRN:  [id number]  DOB:  06/10/1945  CSN: [id number]  Date of encounter: 02/16/2019  Provider: Guinevere Ferrari, MD    Reason for appointment: F/u MM  Underlying disease: IgG Kappa Multiple Myeloma, s/p  autologous stem cell transplant  Outpatient hematologist: Dr. Cyril Mourning  Conditioning regimen: Melphalan 3/14 +3/15  Transplant date: 05/20/2014    Subjective:      Reports worsening neuropathy. He has continued on revlimid, ninlaro and dex. Nothing has helped the pain related to neuropathy including neurontin and lyrica. He is walking with a cane.  His wife had a stroke last summer.    ECOG performance status: 2    Objective:  Current medications:  Outpatient Medications Prior to Visit   Medication Sig   ? acyclovir 400 mg tablet Take 1 tablet (400 mg total) by mouth two (2) times daily.   ? amLODIPine 5 mg tablet TAKE 1 TABLET BY MOUTH EVERY DAY AS DIRECTED   ? ammonium lactate 12% lotion APPLY AND RUB IN A THIN FILM TO AFFECTED AREAS TWICE DAILY.(AM AND PM).   ? ANORO ELLIPTA 62.5-25 MCG/INH inhaler Inhale 1 puff daily .     ? CIALIS 20 MG tablet TAKE 1 TABLET AS NEEDED AS NEEDED   ? dexamethasone 4 mg tablet TAKE 10 TABLETS WEEKLY   ? fluoxetine 20 mg capsule Take 20 mg by mouth daily.   ? omeprazole 10 mg capsule Take 20 mg by mouth daily.   ? ranitidine 150 mg tablet    ? oxycodone 10 mg tablet Take 1 tablet (10 mg total) by mouth every six (6) hours as needed for Moderate Pain or Severe Pain.  Max Daily Amount: 40 mg (Patient not taking: Reported on 02/20/2016.)   ? oxycodone 5 mg tablet Take 2 tablets (10 mg total) by mouth every six (6) hours as needed for mucositis pain. (Patient not taking: Reported on 02/20/2016.)   ? prednisone 10 mg tablet      No facility-administered medications prior to visit.      Lasix   Vitamin D  Lyrica     Documented in clinic chart.    Allergies:  No Known Allergies  Documented in clinic chart. Review of Systems:  A complete review of systems was obtained and was non-contributory unless noted above.    Vitals:  BP 128/64  ~ Pulse (!) 112  ~ Temp 36.6 ?C (97.8 ?F) (Forehead)  ~ Resp 18  ~ Ht 175 cm  ~ Wt 98.4 kg (217 lb)  ~ SpO2 95%  ~ BMI 32.14 kg/m?   Reviewed and documented in clinic chart.    Physical Exam:  General: NAD.   Head: Normocephalic, atraumatic.  Eyes: PERRL without icterus.   ENT: Oropharynx is clear, mucus membranes are moist.  No oral ulcers noted.   Neck: Supple. Trachea midline.   Chest: Respiratory effort appears normal.   Abdomen: Obese. Soft, nontender and nondistended. Bowel sounds are present and normoactive. No organomegaly is appreciated  Musculoskeletal: No edema. No cyanosis. Extremities are warm and well-perfused.   Neurologic:  Oriented to person, place and time.   Hematologic: Scattered ecchymosis on right arm  Dermatologic: No rashes appreciated.   Psychiatric: Affect appropriate.  Pleasant and conversant.     Recent Labs:  Lab Visit on 02/16/2019   Component Date Value Ref Range Status   ? Sodium 02/16/2019 132* 135 - 146 mmol/L Final   ?  Potassium 02/16/2019 4.1  3.6 - 5.3 mmol/L Final   ? Chloride 02/16/2019 102  96 - 106 mmol/L Final   ? Total CO2 02/16/2019 17* 20 - 30 mmol/L Final   ? Anion Gap 02/16/2019 13  8 - 19 mmol/L Final   ? Glucose 02/16/2019 207* 65 - 99 mg/dL Final   ? Creatinine 08/65/7846 1.08  0.60 - 1.30 mg/dL Final   ? GFR Estimate for African American 02/16/2019 78  See GFR Additional Information mL/min/1.50m2 Final   ? GFR Estimate for Non-African Ameri* 02/16/2019 68  See GFR Additional Information mL/min/1.52m2 Final   ? GFR Additional Information 02/16/2019 See Comment   Final    GFR >89        Normal  GFR 60 - 89    Normal to mildly decreased  GFR 45 - 59    Mildly to moderately decreased  GFR 30 - 44    Moderately to severely decreased  GFR 15 - 29    Severely decreased  GFR <15        Kidney failure The CKD-EPI equation was used to estimate GFR and assumes stable creatinine concentrations.  Results are in mL/min/1.73 square meters.   ? Urea Nitrogen 02/16/2019 22  7 - 22 mg/dL Final   ? Calcium 96/29/5284 9.1  8.6 - 10.4 mg/dL Final   ? Total Protein 02/16/2019 10.2* 6.1 - 8.2 g/dL Final   ? Albumin 13/24/4010 4.1  3.9 - 5.0 g/dL Final   ? Bilirubin,Total 02/16/2019 0.4  0.1 - 1.2 mg/dL Final   ? Alkaline Phosphatase 02/16/2019 85  37 - 113 U/L Final   ? Aspartate Aminotransferase 02/16/2019 15  13 - 47 U/L Final   ? Alanine Aminotransferase 02/16/2019 17  8 - 64 U/L Final   ? Lactate Dehydrogenase 02/16/2019 212  125 - 256 U/L Final   ? IgA,Serum 02/16/2019 16* 87 - 426 mg/dL Final   ? IgM,Serum 27/25/3664 6* 44 - 277 mg/dL Final   ? IgG,Serum 40/34/7425 4,780* 726-1,521 mg/dL Final   ? Z-5-GLOVFIEPPIRJJ 02/16/2019 4.1* 1.0 - 2.1 mg/L Final   ? White Blood Cell Count 02/16/2019 6.79  4.16 - 9.95 x10E3/uL Final   ? Red Blood Cell Count 02/16/2019 4.00* 4.41 - 5.95 x10E6/uL Final   ? Hemoglobin 02/16/2019 10.4* 13.5 - 17.1 g/dL Final   ? Hematocrit 88/41/6606 33.0* 38.5 - 52.0 % Final   ? Mean Corpuscular Volume 02/16/2019 82.5  79.3 - 98.6 fL Final   ? Mean Corpuscular Hemoglobin 02/16/2019 26.0* 26.4 - 33.4 pg Final   ? MCH Concentration 02/16/2019 31.5  31.5 - 35.5 g/dL Final   ? Red Cell Distribution Width-SD 02/16/2019 50.9* 36.9 - 48.3 fL Final   ? Red Cell Distribution Width-CV 02/16/2019 16.8* 11.1 - 15.5 % Final   ? Platelet Count, Auto 02/16/2019 173  143 - 398 x10E3/uL Final   ? Mean Platelet Volume 02/16/2019 9.3  9.3 - 13.0 fL Final   ? Nucleated RBC%, automated 02/16/2019 0.0  No Ref. Range % Final    Percent Reference Range Not Reported per accrediting agency   ? Absolute Nucleated RBC Count 02/16/2019 0.00  0.00 - 0.00 x10E3/uL Final   ? Neutrophil Abs (Prelim) 02/16/2019 5.20  See Absolute Neut Ct. x10E3/uL Final This is a preliminary result.  If automated differential see Absolute Neut  Count or if manual differential see Absolute Neut Ct, Manual for final result.   ? Neutrophil Percent, Auto 02/16/2019 76.6  No Ref.  Range % Final    Percent reference range not reported per accrediting agency.  Autodiff confirmed by slide review. Validated by manual slide review   ? Lymphocyte Percent, Auto 02/16/2019 21.1  No Ref. Range % Final    Percent reference range not reported per accrediting agency.     ? Monocyte Percent, Auto 02/16/2019 1.5  No Ref. Range % Final    Percent reference range not reported per accrediting agency.     ? Eosinophil Percent, Auto 02/16/2019 0.0  No Ref. Range % Final    Percent reference range not reported per accrediting agency.     ? Basophil Percent, Auto 02/16/2019 0.1  No Ref. Range % Final    Percent reference range not reported per accrediting agency.     ? Immature Granulocytes% 02/16/2019 0.7  No Reference Range % Final    Percent reference range not reported per accrediting agency.     ? Absolute Neut Count 02/16/2019 5.20  1.80 - 6.90 x10E3/uL Final   ? Absolute Lymphocyte Count 02/16/2019 1.43  1.30 - 3.40 x10E3/uL Final   ? Absolute Mono Count 02/16/2019 0.10* 0.20 - 0.80 x10E3/uL Final   ? Absolute Eos Count 02/16/2019 0.00  0.00 - 0.50 x10E3/uL Final   ? Absolute Baso Count 02/16/2019 0.01  0.00 - 0.10 x10E3/uL Final   ? Absolute Immature Gran Count 02/16/2019 0.05* 0.00 - 0.04 x10E3/uL Final   ? Total Protein. Serum 02/16/2019 9.8* 6.1 - 8.2 g/dL Final     (Additional labs performed in clinic, and reviewed and recorded in clinic chart).    Pertinent imaging:  None recent.    Pertinent pathology:  None recent.    Impression and Recommendations:  Bascom Biel is a 73 y.o. male  Hematological/oncological management issues are as follows: # IgG Kappa Multiple Myeloma s/p autologous stem cell transplant.  His response following KRd was a VGPR prior to transplant and his day +100 evaluation showed the same.     - Conditioning: Melphalan 100mg /m2 x 2   - Myeloma evaluation from today is in process.  He is still seeing Dr. Hendricks Limes with contact number [phone] fax: [phone].  I had previously recommended daratumumab, pomalidomide 2mg  and dexamethasone, but I would like to see the myeloma evaluation prior to making any changes.   - He received his flu vaccine.  ?  # Parethesias/peripheral neuropathy. Stocking glove pattern. Diminished up to ankle, parasthesias in hands. Proprioception intact.  ?  # Infectious prophylaxis  - cont Acyclovir while on a proteosome inhibitor  ?  # Pancytopenia secondary to high-dose chemotherapy:   Counts recovered. Mild cytopenias likely due to treatment.  ?  # COPD  - Home regimen: Anoro Ellipta 62.5-25 mcg/inh 1 puff daily is non-formulary, pt doesn't have with him  - PFTs with mild obstructive defect, DLCO WNL  ?  # BPH  - Terazosin

## 2019-03-24 ENCOUNTER — Ambulatory Visit: Payer: PRIVATE HEALTH INSURANCE

## 2019-03-24 DIAGNOSIS — Z23 Encounter for immunization: Secondary | ICD-10-CM

## 2019-04-02 ENCOUNTER — Ambulatory Visit: Payer: Self-pay

## 2019-04-10 ENCOUNTER — Ambulatory Visit: Payer: Self-pay

## 2019-04-13 ENCOUNTER — Ambulatory Visit: Payer: Self-pay

## 2019-12-24 DIAGNOSIS — M76891 Other specified enthesopathies of right lower limb, excluding foot: Secondary | ICD-10-CM | POA: Diagnosis not present

## 2019-12-25 DIAGNOSIS — M5416 Radiculopathy, lumbar region: Secondary | ICD-10-CM | POA: Diagnosis not present

## 2020-02-04 DIAGNOSIS — Z79899 Other long term (current) drug therapy: Secondary | ICD-10-CM | POA: Diagnosis not present

## 2020-02-04 DIAGNOSIS — Z0000 Encounter for general adult medical examination without abnormal findings: Secondary | ICD-10-CM | POA: Diagnosis not present

## 2020-02-04 DIAGNOSIS — Z125 Encounter for screening for malignant neoplasm of prostate: Secondary | ICD-10-CM | POA: Diagnosis not present

## 2020-02-04 DIAGNOSIS — E291 Testicular hypofunction: Secondary | ICD-10-CM | POA: Diagnosis not present

## 2020-02-04 DIAGNOSIS — M5416 Radiculopathy, lumbar region: Secondary | ICD-10-CM | POA: Diagnosis not present

## 2020-02-04 DIAGNOSIS — I10 Essential (primary) hypertension: Secondary | ICD-10-CM | POA: Diagnosis not present

## 2020-02-04 DIAGNOSIS — E669 Obesity, unspecified: Secondary | ICD-10-CM | POA: Diagnosis not present

## 2020-02-04 DIAGNOSIS — I447 Left bundle-branch block, unspecified: Secondary | ICD-10-CM | POA: Diagnosis not present

## 2020-02-04 DIAGNOSIS — E559 Vitamin D deficiency, unspecified: Secondary | ICD-10-CM | POA: Diagnosis not present

## 2020-02-04 DIAGNOSIS — E785 Hyperlipidemia, unspecified: Secondary | ICD-10-CM | POA: Diagnosis not present

## 2020-02-04 DIAGNOSIS — Z1389 Encounter for screening for other disorder: Secondary | ICD-10-CM | POA: Diagnosis not present

## 2020-02-05 DIAGNOSIS — Z79899 Other long term (current) drug therapy: Secondary | ICD-10-CM | POA: Diagnosis not present

## 2020-02-05 DIAGNOSIS — E291 Testicular hypofunction: Secondary | ICD-10-CM | POA: Diagnosis not present

## 2020-02-05 DIAGNOSIS — I10 Essential (primary) hypertension: Secondary | ICD-10-CM | POA: Diagnosis not present

## 2020-02-05 DIAGNOSIS — E559 Vitamin D deficiency, unspecified: Secondary | ICD-10-CM | POA: Diagnosis not present

## 2020-02-05 DIAGNOSIS — E785 Hyperlipidemia, unspecified: Secondary | ICD-10-CM | POA: Diagnosis not present

## 2020-02-10 DIAGNOSIS — E538 Deficiency of other specified B group vitamins: Secondary | ICD-10-CM | POA: Diagnosis not present

## 2020-02-10 DIAGNOSIS — E291 Testicular hypofunction: Secondary | ICD-10-CM | POA: Diagnosis not present

## 2020-02-15 ENCOUNTER — Ambulatory Visit: Payer: PRIVATE HEALTH INSURANCE | Attending: Hematology & Oncology

## 2020-02-15 ENCOUNTER — Ambulatory Visit: Payer: PRIVATE HEALTH INSURANCE

## 2020-02-17 DIAGNOSIS — E538 Deficiency of other specified B group vitamins: Secondary | ICD-10-CM | POA: Diagnosis not present

## 2020-02-24 DIAGNOSIS — E291 Testicular hypofunction: Secondary | ICD-10-CM | POA: Diagnosis not present

## 2020-02-24 DIAGNOSIS — E538 Deficiency of other specified B group vitamins: Secondary | ICD-10-CM | POA: Diagnosis not present

## 2020-03-02 DIAGNOSIS — E538 Deficiency of other specified B group vitamins: Secondary | ICD-10-CM | POA: Diagnosis not present

## 2020-03-09 DIAGNOSIS — E538 Deficiency of other specified B group vitamins: Secondary | ICD-10-CM | POA: Diagnosis not present

## 2020-03-09 DIAGNOSIS — E291 Testicular hypofunction: Secondary | ICD-10-CM | POA: Diagnosis not present

## 2020-03-16 DIAGNOSIS — E538 Deficiency of other specified B group vitamins: Secondary | ICD-10-CM | POA: Diagnosis not present

## 2020-03-16 DIAGNOSIS — C9000 Multiple myeloma not having achieved remission: Secondary | ICD-10-CM

## 2020-03-17 ENCOUNTER — Ambulatory Visit: Payer: PRIVATE HEALTH INSURANCE | Attending: Hematology & Oncology

## 2020-03-17 ENCOUNTER — Ambulatory Visit: Payer: PRIVATE HEALTH INSURANCE

## 2020-03-23 DIAGNOSIS — E538 Deficiency of other specified B group vitamins: Secondary | ICD-10-CM | POA: Diagnosis not present

## 2020-03-23 DIAGNOSIS — E291 Testicular hypofunction: Secondary | ICD-10-CM | POA: Diagnosis not present

## 2020-03-30 DIAGNOSIS — E538 Deficiency of other specified B group vitamins: Secondary | ICD-10-CM | POA: Diagnosis not present

## 2020-04-06 DIAGNOSIS — E291 Testicular hypofunction: Secondary | ICD-10-CM | POA: Diagnosis not present

## 2020-04-06 DIAGNOSIS — E538 Deficiency of other specified B group vitamins: Secondary | ICD-10-CM | POA: Diagnosis not present

## 2020-04-13 DIAGNOSIS — E538 Deficiency of other specified B group vitamins: Secondary | ICD-10-CM | POA: Diagnosis not present

## 2020-04-20 DIAGNOSIS — E291 Testicular hypofunction: Secondary | ICD-10-CM | POA: Diagnosis not present

## 2020-04-20 DIAGNOSIS — E538 Deficiency of other specified B group vitamins: Secondary | ICD-10-CM | POA: Diagnosis not present

## 2020-04-27 DIAGNOSIS — E538 Deficiency of other specified B group vitamins: Secondary | ICD-10-CM | POA: Diagnosis not present

## 2020-05-04 DIAGNOSIS — E291 Testicular hypofunction: Secondary | ICD-10-CM | POA: Diagnosis not present

## 2020-05-04 DIAGNOSIS — E538 Deficiency of other specified B group vitamins: Secondary | ICD-10-CM | POA: Diagnosis not present

## 2020-05-10 DIAGNOSIS — E7841 Elevated Lipoprotein(a): Secondary | ICD-10-CM | POA: Diagnosis not present

## 2020-05-10 DIAGNOSIS — E538 Deficiency of other specified B group vitamins: Secondary | ICD-10-CM | POA: Diagnosis not present

## 2020-05-10 DIAGNOSIS — E559 Vitamin D deficiency, unspecified: Secondary | ICD-10-CM | POA: Diagnosis not present

## 2020-05-10 DIAGNOSIS — E291 Testicular hypofunction: Secondary | ICD-10-CM | POA: Diagnosis not present

## 2020-05-11 DIAGNOSIS — E538 Deficiency of other specified B group vitamins: Secondary | ICD-10-CM | POA: Diagnosis not present

## 2020-05-11 DIAGNOSIS — E291 Testicular hypofunction: Secondary | ICD-10-CM | POA: Diagnosis not present

## 2020-05-18 DIAGNOSIS — E538 Deficiency of other specified B group vitamins: Secondary | ICD-10-CM | POA: Diagnosis not present

## 2020-05-25 DIAGNOSIS — E291 Testicular hypofunction: Secondary | ICD-10-CM | POA: Diagnosis not present

## 2020-05-25 DIAGNOSIS — E538 Deficiency of other specified B group vitamins: Secondary | ICD-10-CM | POA: Diagnosis not present

## 2020-06-01 DIAGNOSIS — E538 Deficiency of other specified B group vitamins: Secondary | ICD-10-CM | POA: Diagnosis not present

## 2020-06-08 DIAGNOSIS — E291 Testicular hypofunction: Secondary | ICD-10-CM | POA: Diagnosis not present

## 2020-06-08 DIAGNOSIS — E538 Deficiency of other specified B group vitamins: Secondary | ICD-10-CM | POA: Diagnosis not present

## 2020-06-15 DIAGNOSIS — E538 Deficiency of other specified B group vitamins: Secondary | ICD-10-CM | POA: Diagnosis not present

## 2020-06-22 DIAGNOSIS — E291 Testicular hypofunction: Secondary | ICD-10-CM | POA: Diagnosis not present

## 2020-06-22 DIAGNOSIS — E538 Deficiency of other specified B group vitamins: Secondary | ICD-10-CM | POA: Diagnosis not present

## 2020-06-29 DIAGNOSIS — E538 Deficiency of other specified B group vitamins: Secondary | ICD-10-CM | POA: Diagnosis not present

## 2020-07-05 DIAGNOSIS — E291 Testicular hypofunction: Secondary | ICD-10-CM | POA: Diagnosis not present

## 2020-07-05 DIAGNOSIS — E538 Deficiency of other specified B group vitamins: Secondary | ICD-10-CM | POA: Diagnosis not present

## 2020-07-07 DIAGNOSIS — H524 Presbyopia: Secondary | ICD-10-CM | POA: Diagnosis not present

## 2020-07-07 DIAGNOSIS — H2513 Age-related nuclear cataract, bilateral: Secondary | ICD-10-CM | POA: Diagnosis not present

## 2020-07-07 DIAGNOSIS — H5203 Hypermetropia, bilateral: Secondary | ICD-10-CM | POA: Diagnosis not present

## 2020-07-12 DIAGNOSIS — E7849 Other hyperlipidemia: Secondary | ICD-10-CM | POA: Diagnosis not present

## 2020-07-13 DIAGNOSIS — E538 Deficiency of other specified B group vitamins: Secondary | ICD-10-CM | POA: Diagnosis not present

## 2020-08-03 DIAGNOSIS — E291 Testicular hypofunction: Secondary | ICD-10-CM | POA: Diagnosis not present

## 2020-08-03 DIAGNOSIS — E538 Deficiency of other specified B group vitamins: Secondary | ICD-10-CM | POA: Diagnosis not present

## 2020-08-10 DIAGNOSIS — E538 Deficiency of other specified B group vitamins: Secondary | ICD-10-CM | POA: Diagnosis not present

## 2020-08-17 DIAGNOSIS — E291 Testicular hypofunction: Secondary | ICD-10-CM | POA: Diagnosis not present

## 2020-08-17 DIAGNOSIS — E538 Deficiency of other specified B group vitamins: Secondary | ICD-10-CM | POA: Diagnosis not present

## 2020-08-24 DIAGNOSIS — Z20822 Contact with and (suspected) exposure to covid-19: Secondary | ICD-10-CM | POA: Diagnosis not present

## 2020-08-25 DIAGNOSIS — E538 Deficiency of other specified B group vitamins: Secondary | ICD-10-CM | POA: Diagnosis not present

## 2020-09-28 DIAGNOSIS — E291 Testicular hypofunction: Secondary | ICD-10-CM | POA: Diagnosis not present

## 2020-09-28 DIAGNOSIS — E538 Deficiency of other specified B group vitamins: Secondary | ICD-10-CM | POA: Diagnosis not present

## 2020-10-05 DIAGNOSIS — E538 Deficiency of other specified B group vitamins: Secondary | ICD-10-CM | POA: Diagnosis not present

## 2020-10-12 DIAGNOSIS — E291 Testicular hypofunction: Secondary | ICD-10-CM | POA: Diagnosis not present

## 2020-10-19 DIAGNOSIS — E538 Deficiency of other specified B group vitamins: Secondary | ICD-10-CM | POA: Diagnosis not present

## 2020-10-26 DIAGNOSIS — E538 Deficiency of other specified B group vitamins: Secondary | ICD-10-CM | POA: Diagnosis not present

## 2020-10-26 DIAGNOSIS — E291 Testicular hypofunction: Secondary | ICD-10-CM | POA: Diagnosis not present

## 2020-11-02 DIAGNOSIS — E538 Deficiency of other specified B group vitamins: Secondary | ICD-10-CM | POA: Diagnosis not present

## 2020-11-09 DIAGNOSIS — E538 Deficiency of other specified B group vitamins: Secondary | ICD-10-CM | POA: Diagnosis not present

## 2020-11-09 DIAGNOSIS — E291 Testicular hypofunction: Secondary | ICD-10-CM | POA: Diagnosis not present

## 2020-11-16 DIAGNOSIS — E538 Deficiency of other specified B group vitamins: Secondary | ICD-10-CM | POA: Diagnosis not present

## 2020-11-23 DIAGNOSIS — E538 Deficiency of other specified B group vitamins: Secondary | ICD-10-CM | POA: Diagnosis not present

## 2020-11-23 DIAGNOSIS — E291 Testicular hypofunction: Secondary | ICD-10-CM | POA: Diagnosis not present

## 2020-11-30 DIAGNOSIS — E538 Deficiency of other specified B group vitamins: Secondary | ICD-10-CM | POA: Diagnosis not present

## 2020-12-07 DIAGNOSIS — E291 Testicular hypofunction: Secondary | ICD-10-CM | POA: Diagnosis not present

## 2020-12-07 DIAGNOSIS — E538 Deficiency of other specified B group vitamins: Secondary | ICD-10-CM | POA: Diagnosis not present

## 2020-12-14 DIAGNOSIS — Z23 Encounter for immunization: Secondary | ICD-10-CM | POA: Diagnosis not present

## 2020-12-14 DIAGNOSIS — E538 Deficiency of other specified B group vitamins: Secondary | ICD-10-CM | POA: Diagnosis not present

## 2020-12-21 DIAGNOSIS — E291 Testicular hypofunction: Secondary | ICD-10-CM | POA: Diagnosis not present

## 2020-12-21 DIAGNOSIS — E538 Deficiency of other specified B group vitamins: Secondary | ICD-10-CM | POA: Diagnosis not present

## 2021-01-04 DIAGNOSIS — E538 Deficiency of other specified B group vitamins: Secondary | ICD-10-CM | POA: Diagnosis not present

## 2021-01-04 DIAGNOSIS — E291 Testicular hypofunction: Secondary | ICD-10-CM | POA: Diagnosis not present

## 2021-01-11 DIAGNOSIS — E538 Deficiency of other specified B group vitamins: Secondary | ICD-10-CM | POA: Diagnosis not present

## 2021-01-18 DIAGNOSIS — E291 Testicular hypofunction: Secondary | ICD-10-CM | POA: Diagnosis not present

## 2021-01-18 DIAGNOSIS — E538 Deficiency of other specified B group vitamins: Secondary | ICD-10-CM | POA: Diagnosis not present

## 2021-01-25 DIAGNOSIS — E538 Deficiency of other specified B group vitamins: Secondary | ICD-10-CM | POA: Diagnosis not present

## 2021-02-01 DIAGNOSIS — E291 Testicular hypofunction: Secondary | ICD-10-CM | POA: Diagnosis not present

## 2021-02-01 DIAGNOSIS — E538 Deficiency of other specified B group vitamins: Secondary | ICD-10-CM | POA: Diagnosis not present

## 2021-02-08 DIAGNOSIS — R030 Elevated blood-pressure reading, without diagnosis of hypertension: Secondary | ICD-10-CM | POA: Diagnosis not present

## 2021-02-08 DIAGNOSIS — E785 Hyperlipidemia, unspecified: Secondary | ICD-10-CM | POA: Diagnosis not present

## 2021-02-08 DIAGNOSIS — H9190 Unspecified hearing loss, unspecified ear: Secondary | ICD-10-CM | POA: Diagnosis not present

## 2021-02-08 DIAGNOSIS — R351 Nocturia: Secondary | ICD-10-CM | POA: Diagnosis not present

## 2021-02-08 DIAGNOSIS — Z79899 Other long term (current) drug therapy: Secondary | ICD-10-CM | POA: Diagnosis not present

## 2021-02-08 DIAGNOSIS — E291 Testicular hypofunction: Secondary | ICD-10-CM | POA: Diagnosis not present

## 2021-02-08 DIAGNOSIS — E538 Deficiency of other specified B group vitamins: Secondary | ICD-10-CM | POA: Diagnosis not present

## 2021-02-08 DIAGNOSIS — Z0001 Encounter for general adult medical examination with abnormal findings: Secondary | ICD-10-CM | POA: Diagnosis not present

## 2021-02-08 DIAGNOSIS — E559 Vitamin D deficiency, unspecified: Secondary | ICD-10-CM | POA: Diagnosis not present

## 2021-02-08 DIAGNOSIS — I447 Left bundle-branch block, unspecified: Secondary | ICD-10-CM | POA: Diagnosis not present

## 2021-02-15 DIAGNOSIS — E291 Testicular hypofunction: Secondary | ICD-10-CM | POA: Diagnosis not present

## 2021-03-01 DIAGNOSIS — E291 Testicular hypofunction: Secondary | ICD-10-CM | POA: Diagnosis not present

## 2021-03-08 DIAGNOSIS — E538 Deficiency of other specified B group vitamins: Secondary | ICD-10-CM | POA: Diagnosis not present

## 2021-03-22 DIAGNOSIS — I499 Cardiac arrhythmia, unspecified: Secondary | ICD-10-CM | POA: Diagnosis not present

## 2021-03-22 DIAGNOSIS — I10 Essential (primary) hypertension: Secondary | ICD-10-CM | POA: Diagnosis not present

## 2021-03-22 DIAGNOSIS — E291 Testicular hypofunction: Secondary | ICD-10-CM | POA: Diagnosis not present

## 2021-04-03 DIAGNOSIS — E291 Testicular hypofunction: Secondary | ICD-10-CM | POA: Diagnosis not present

## 2021-04-03 DIAGNOSIS — E538 Deficiency of other specified B group vitamins: Secondary | ICD-10-CM | POA: Diagnosis not present

## 2021-04-19 DIAGNOSIS — I10 Essential (primary) hypertension: Secondary | ICD-10-CM | POA: Diagnosis not present

## 2021-04-19 DIAGNOSIS — E291 Testicular hypofunction: Secondary | ICD-10-CM | POA: Diagnosis not present

## 2021-05-03 DIAGNOSIS — E538 Deficiency of other specified B group vitamins: Secondary | ICD-10-CM | POA: Diagnosis not present

## 2021-05-03 DIAGNOSIS — E291 Testicular hypofunction: Secondary | ICD-10-CM | POA: Diagnosis not present

## 2021-05-17 DIAGNOSIS — I10 Essential (primary) hypertension: Secondary | ICD-10-CM | POA: Diagnosis not present

## 2021-05-17 DIAGNOSIS — E291 Testicular hypofunction: Secondary | ICD-10-CM | POA: Diagnosis not present

## 2021-05-17 DIAGNOSIS — E538 Deficiency of other specified B group vitamins: Secondary | ICD-10-CM | POA: Diagnosis not present

## 2021-05-31 DIAGNOSIS — E291 Testicular hypofunction: Secondary | ICD-10-CM | POA: Diagnosis not present

## 2021-05-31 DIAGNOSIS — E538 Deficiency of other specified B group vitamins: Secondary | ICD-10-CM | POA: Diagnosis not present

## 2021-06-16 DIAGNOSIS — E291 Testicular hypofunction: Secondary | ICD-10-CM | POA: Diagnosis not present

## 2021-06-30 DIAGNOSIS — E291 Testicular hypofunction: Secondary | ICD-10-CM | POA: Diagnosis not present

## 2021-06-30 DIAGNOSIS — E538 Deficiency of other specified B group vitamins: Secondary | ICD-10-CM | POA: Diagnosis not present

## 2021-07-17 DIAGNOSIS — E291 Testicular hypofunction: Secondary | ICD-10-CM | POA: Diagnosis not present

## 2021-08-01 DIAGNOSIS — E291 Testicular hypofunction: Secondary | ICD-10-CM | POA: Diagnosis not present

## 2021-08-01 DIAGNOSIS — E538 Deficiency of other specified B group vitamins: Secondary | ICD-10-CM | POA: Diagnosis not present

## 2021-08-17 DIAGNOSIS — E291 Testicular hypofunction: Secondary | ICD-10-CM | POA: Diagnosis not present

## 2021-08-30 DIAGNOSIS — B351 Tinea unguium: Secondary | ICD-10-CM | POA: Diagnosis not present

## 2021-09-01 DIAGNOSIS — E291 Testicular hypofunction: Secondary | ICD-10-CM | POA: Diagnosis not present

## 2021-09-01 DIAGNOSIS — E538 Deficiency of other specified B group vitamins: Secondary | ICD-10-CM | POA: Diagnosis not present

## 2021-09-15 DIAGNOSIS — E291 Testicular hypofunction: Secondary | ICD-10-CM | POA: Diagnosis not present

## 2021-09-20 DIAGNOSIS — Z79899 Other long term (current) drug therapy: Secondary | ICD-10-CM | POA: Diagnosis not present

## 2021-09-20 DIAGNOSIS — B351 Tinea unguium: Secondary | ICD-10-CM | POA: Diagnosis not present

## 2021-10-04 DIAGNOSIS — D175 Benign lipomatous neoplasm of intra-abdominal organs: Secondary | ICD-10-CM | POA: Diagnosis not present

## 2021-10-04 DIAGNOSIS — Z09 Encounter for follow-up examination after completed treatment for conditions other than malignant neoplasm: Secondary | ICD-10-CM | POA: Diagnosis not present

## 2021-10-04 DIAGNOSIS — D123 Benign neoplasm of transverse colon: Secondary | ICD-10-CM | POA: Diagnosis not present

## 2021-10-04 DIAGNOSIS — D120 Benign neoplasm of cecum: Secondary | ICD-10-CM | POA: Diagnosis not present

## 2021-10-04 DIAGNOSIS — Z86010 Personal history of colonic polyps: Secondary | ICD-10-CM | POA: Diagnosis not present

## 2021-10-06 DIAGNOSIS — E538 Deficiency of other specified B group vitamins: Secondary | ICD-10-CM | POA: Diagnosis not present

## 2021-10-06 DIAGNOSIS — D123 Benign neoplasm of transverse colon: Secondary | ICD-10-CM | POA: Diagnosis not present

## 2021-10-06 DIAGNOSIS — D120 Benign neoplasm of cecum: Secondary | ICD-10-CM | POA: Diagnosis not present

## 2021-10-06 DIAGNOSIS — E291 Testicular hypofunction: Secondary | ICD-10-CM | POA: Diagnosis not present

## 2021-10-20 DIAGNOSIS — E291 Testicular hypofunction: Secondary | ICD-10-CM | POA: Diagnosis not present

## 2021-12-08 DIAGNOSIS — E291 Testicular hypofunction: Secondary | ICD-10-CM | POA: Diagnosis not present

## 2021-12-15 DIAGNOSIS — E559 Vitamin D deficiency, unspecified: Secondary | ICD-10-CM | POA: Diagnosis not present

## 2021-12-15 DIAGNOSIS — E538 Deficiency of other specified B group vitamins: Secondary | ICD-10-CM | POA: Diagnosis not present

## 2021-12-15 DIAGNOSIS — I10 Essential (primary) hypertension: Secondary | ICD-10-CM | POA: Diagnosis not present

## 2021-12-15 DIAGNOSIS — Z23 Encounter for immunization: Secondary | ICD-10-CM | POA: Diagnosis not present

## 2021-12-15 DIAGNOSIS — E291 Testicular hypofunction: Secondary | ICD-10-CM | POA: Diagnosis not present

## 2021-12-29 DIAGNOSIS — E291 Testicular hypofunction: Secondary | ICD-10-CM | POA: Diagnosis not present

## 2022-01-17 DIAGNOSIS — Z23 Encounter for immunization: Secondary | ICD-10-CM | POA: Diagnosis not present

## 2022-01-17 DIAGNOSIS — E291 Testicular hypofunction: Secondary | ICD-10-CM | POA: Diagnosis not present

## 2022-02-09 DIAGNOSIS — E291 Testicular hypofunction: Secondary | ICD-10-CM | POA: Diagnosis not present

## 2022-03-02 DIAGNOSIS — E291 Testicular hypofunction: Secondary | ICD-10-CM | POA: Diagnosis not present

## 2022-03-23 DIAGNOSIS — E291 Testicular hypofunction: Secondary | ICD-10-CM | POA: Diagnosis not present

## 2022-03-30 DIAGNOSIS — Z1331 Encounter for screening for depression: Secondary | ICD-10-CM | POA: Diagnosis not present

## 2022-03-30 DIAGNOSIS — I447 Left bundle-branch block, unspecified: Secondary | ICD-10-CM | POA: Diagnosis not present

## 2022-03-30 DIAGNOSIS — E538 Deficiency of other specified B group vitamins: Secondary | ICD-10-CM | POA: Diagnosis not present

## 2022-03-30 DIAGNOSIS — Z23 Encounter for immunization: Secondary | ICD-10-CM | POA: Diagnosis not present

## 2022-03-30 DIAGNOSIS — E559 Vitamin D deficiency, unspecified: Secondary | ICD-10-CM | POA: Diagnosis not present

## 2022-03-30 DIAGNOSIS — E7849 Other hyperlipidemia: Secondary | ICD-10-CM | POA: Diagnosis not present

## 2022-03-30 DIAGNOSIS — E291 Testicular hypofunction: Secondary | ICD-10-CM | POA: Diagnosis not present

## 2022-03-30 DIAGNOSIS — Z125 Encounter for screening for malignant neoplasm of prostate: Secondary | ICD-10-CM | POA: Diagnosis not present

## 2022-03-30 DIAGNOSIS — I10 Essential (primary) hypertension: Secondary | ICD-10-CM | POA: Diagnosis not present

## 2022-03-30 DIAGNOSIS — R351 Nocturia: Secondary | ICD-10-CM | POA: Diagnosis not present

## 2022-03-30 DIAGNOSIS — Z0000 Encounter for general adult medical examination without abnormal findings: Secondary | ICD-10-CM | POA: Diagnosis not present

## 2022-04-06 DIAGNOSIS — E291 Testicular hypofunction: Secondary | ICD-10-CM | POA: Diagnosis not present

## 2022-04-27 DIAGNOSIS — E291 Testicular hypofunction: Secondary | ICD-10-CM | POA: Diagnosis not present

## 2022-05-18 DIAGNOSIS — E291 Testicular hypofunction: Secondary | ICD-10-CM | POA: Diagnosis not present

## 2022-06-08 DIAGNOSIS — E291 Testicular hypofunction: Secondary | ICD-10-CM | POA: Diagnosis not present

## 2022-06-29 DIAGNOSIS — E291 Testicular hypofunction: Secondary | ICD-10-CM | POA: Diagnosis not present

## 2022-07-20 DIAGNOSIS — E291 Testicular hypofunction: Secondary | ICD-10-CM | POA: Diagnosis not present

## 2022-08-31 DIAGNOSIS — E291 Testicular hypofunction: Secondary | ICD-10-CM | POA: Diagnosis not present

## 2022-09-17 DIAGNOSIS — I868 Varicose veins of other specified sites: Secondary | ICD-10-CM | POA: Diagnosis not present

## 2022-09-17 DIAGNOSIS — E7849 Other hyperlipidemia: Secondary | ICD-10-CM | POA: Diagnosis not present

## 2022-09-17 DIAGNOSIS — I10 Essential (primary) hypertension: Secondary | ICD-10-CM | POA: Diagnosis not present

## 2022-09-17 DIAGNOSIS — E538 Deficiency of other specified B group vitamins: Secondary | ICD-10-CM | POA: Diagnosis not present

## 2022-09-17 DIAGNOSIS — R600 Localized edema: Secondary | ICD-10-CM | POA: Diagnosis not present

## 2022-09-17 DIAGNOSIS — E291 Testicular hypofunction: Secondary | ICD-10-CM | POA: Diagnosis not present

## 2022-09-17 DIAGNOSIS — M792 Neuralgia and neuritis, unspecified: Secondary | ICD-10-CM | POA: Diagnosis not present

## 2022-09-17 DIAGNOSIS — I8390 Asymptomatic varicose veins of unspecified lower extremity: Secondary | ICD-10-CM | POA: Diagnosis not present

## 2022-09-17 DIAGNOSIS — E559 Vitamin D deficiency, unspecified: Secondary | ICD-10-CM | POA: Diagnosis not present

## 2022-09-19 ENCOUNTER — Other Ambulatory Visit (HOSPITAL_COMMUNITY): Payer: Self-pay

## 2022-09-19 ENCOUNTER — Other Ambulatory Visit: Payer: Self-pay

## 2022-09-19 DIAGNOSIS — R600 Localized edema: Secondary | ICD-10-CM | POA: Diagnosis not present

## 2022-09-19 MED ORDER — APIXABAN 5 MG PO TABS
10.0000 mg | ORAL_TABLET | Freq: Two times a day (BID) | ORAL | 0 refills | Status: DC
  Filled 2022-09-19: qty 28, 7d supply, fill #0

## 2022-09-21 ENCOUNTER — Other Ambulatory Visit (HOSPITAL_COMMUNITY): Payer: Self-pay

## 2022-09-21 DIAGNOSIS — E538 Deficiency of other specified B group vitamins: Secondary | ICD-10-CM | POA: Diagnosis not present

## 2022-09-21 DIAGNOSIS — I10 Essential (primary) hypertension: Secondary | ICD-10-CM | POA: Diagnosis not present

## 2022-09-21 DIAGNOSIS — I82412 Acute embolism and thrombosis of left femoral vein: Secondary | ICD-10-CM | POA: Diagnosis not present

## 2022-09-21 MED ORDER — ELIQUIS 5 MG PO TABS
5.0000 mg | ORAL_TABLET | Freq: Two times a day (BID) | ORAL | 1 refills | Status: DC
  Filled 2022-09-21 – 2022-09-24 (×2): qty 180, 90d supply, fill #0
  Filled 2022-09-24: qty 60, 30d supply, fill #0
  Filled 2022-10-23 (×3): qty 60, 30d supply, fill #1

## 2022-09-22 ENCOUNTER — Other Ambulatory Visit (HOSPITAL_COMMUNITY): Payer: Self-pay

## 2022-09-24 ENCOUNTER — Other Ambulatory Visit (HOSPITAL_COMMUNITY): Payer: Self-pay

## 2022-09-25 ENCOUNTER — Other Ambulatory Visit (HOSPITAL_COMMUNITY): Payer: Self-pay

## 2022-10-23 ENCOUNTER — Other Ambulatory Visit (HOSPITAL_COMMUNITY): Payer: Self-pay

## 2022-10-31 DIAGNOSIS — R748 Abnormal levels of other serum enzymes: Secondary | ICD-10-CM | POA: Diagnosis not present

## 2022-10-31 DIAGNOSIS — D751 Secondary polycythemia: Secondary | ICD-10-CM | POA: Diagnosis not present

## 2022-10-31 DIAGNOSIS — Z79899 Other long term (current) drug therapy: Secondary | ICD-10-CM | POA: Diagnosis not present

## 2022-11-14 DIAGNOSIS — D044 Carcinoma in situ of skin of scalp and neck: Secondary | ICD-10-CM | POA: Diagnosis not present

## 2022-11-14 DIAGNOSIS — L821 Other seborrheic keratosis: Secondary | ICD-10-CM | POA: Diagnosis not present

## 2022-11-14 DIAGNOSIS — D485 Neoplasm of uncertain behavior of skin: Secondary | ICD-10-CM | POA: Diagnosis not present

## 2022-11-16 ENCOUNTER — Encounter (HOSPITAL_COMMUNITY): Payer: Self-pay

## 2022-11-16 ENCOUNTER — Emergency Department (HOSPITAL_COMMUNITY): Payer: TRICARE For Life (TFL)

## 2022-11-16 ENCOUNTER — Other Ambulatory Visit: Payer: Self-pay

## 2022-11-16 ENCOUNTER — Emergency Department (HOSPITAL_COMMUNITY)
Admission: EM | Admit: 2022-11-16 | Discharge: 2022-11-16 | Disposition: A | Discharge status: Home / Self Care | Payer: Medicare Other | Attending: Emergency Medicine | Admitting: Emergency Medicine

## 2022-11-16 DIAGNOSIS — R079 Chest pain, unspecified: Secondary | ICD-10-CM

## 2022-11-16 DIAGNOSIS — R072 Precordial pain: Secondary | ICD-10-CM | POA: Insufficient documentation

## 2022-11-16 DIAGNOSIS — Z7901 Long term (current) use of anticoagulants: Secondary | ICD-10-CM | POA: Diagnosis not present

## 2022-11-16 HISTORY — DX: Acute embolism and thrombosis of unspecified deep veins of unspecified lower extremity: I82.409

## 2022-11-16 LAB — BASIC METABOLIC PANEL
Anion gap: 12 (ref 5–15)
BUN: 31 mg/dL — ABNORMAL HIGH (ref 8–23)
CO2: 20 mmol/L — ABNORMAL LOW (ref 22–32)
Calcium: 9.2 mg/dL (ref 8.9–10.3)
Chloride: 105 mmol/L (ref 98–111)
Creatinine, Ser: 1.31 mg/dL — ABNORMAL HIGH (ref 0.61–1.24)
GFR, Estimated: 56 mL/min — ABNORMAL LOW (ref >60)
Glucose, Bld: 101 mg/dL — ABNORMAL HIGH (ref 70–99)
Potassium: 4.2 mmol/L (ref 3.5–5.1)
Sodium: 137 mmol/L (ref 135–145)

## 2022-11-16 LAB — CBC
HCT: 41.6 % (ref 39.0–52.0)
Hemoglobin: 14.3 g/dL (ref 13.0–17.0)
MCH: 32.9 pg (ref 26.0–34.0)
MCHC: 34.4 g/dL (ref 30.0–36.0)
MCV: 95.6 fL (ref 80.0–100.0)
Platelets: 178 10*3/uL (ref 150–400)
RBC: 4.35 MIL/uL (ref 4.22–5.81)
RDW: 12.3 % (ref 11.5–15.5)
WBC: 7.5 10*3/uL (ref 4.0–10.5)
nRBC: 0 % (ref 0.0–0.2)

## 2022-11-16 LAB — TROPONIN I (HIGH SENSITIVITY)
Troponin I (High Sensitivity): 15 ng/L (ref <18)
Troponin I (High Sensitivity): 12 ng/L (ref <18)

## 2022-11-16 NOTE — ED Provider Notes (Signed)
[facility]PT [facility] Emergency Department Provider Note MRN:  [id number]  Arrival date & time: 11/16/22     Chief Complaint   Chest Pain   History of Present Illness   Kevin Caldwell. is a 77 y.o. year-old male presents to the ED with chief complaint of central chest pain yesterday and today.  He had one episode around noon yesterday and today.  Each episode lasted about 5 minutes.  Symptoms have now resolved. 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  He has not missed any doses.  Denies any associated SOB, radiating pain, n/v, or diaphoresis.  He denies fever, chills, or cough.  He has follow-up with his doctor on Tuesday.  History provided by patient.   Review of Systems  Pertinent positive and negative review of systems noted in HPI.    Physical Exam   Vitals:   11/16/22 2218 11/16/22 2236  BP: (!) 151/82   Pulse: 61   Resp: 15   Temp:  98 F (36.7 C)  SpO2: 99%     CONSTITUTIONAL:  well-appearing, NAD NEURO:  Alert and oriented x 3, CN 3-12 grossly intact EYES:  eyes equal and reactive ENT/NECK:  Supple, no stridor  CARDIO:  normal rate, regular rhythm, appears well-perfused  PULM:  No respiratory distress, CTAB GI/GU:  non-distended,  MSK/SPINE:  No gross deformities, no edema, moves all extremities  SKIN:  no rash, atraumatic   *Additional and/or pertinent findings included in MDM below  Diagnostic and Interventional Summary    EKG Interpretation Date/Time:  Friday November 16 2022 16:24:42 EDT Ventricular Rate:  80 PR Interval:  172 QRS Duration:  136 QT Interval:  408 QTC Calculation: 470 R Axis:   28  Text Interpretation: Sinus rhythm with Premature atrial complexes Left bundle branch block Abnormal ECG No old tracing to compare Confirmed by Melene Plan [phone]) on 11/16/2022 10:30:59 PM       Labs Reviewed  BASIC METABOLIC PANEL - Abnormal; Notable for the following components:      Result Value   CO2 20 (*)    Glucose, Bld 101  (*)    BUN 31 (*)    Creatinine, Ser 1.31 (*)    GFR, Estimated 56 (*)    All other components within normal limits  CBC  TROPONIN I (HIGH SENSITIVITY)  TROPONIN I (HIGH SENSITIVITY)    DG Chest 2 View  Final Result      Medications - No data to display   Procedures  /  Critical Care Procedures  ED Course and Medical Decision Making  I have reviewed the triage vital signs, the nursing notes, and pertinent available records from the EMR.  Social Determinants Affecting Complexity of Care: Patient has no clinically significant social determinants affecting this chief complaint..   ED Course:    Medical Decision Making Patient here with 2 episodes of chest pain that happened once today and once yesterday.  Each episode lasted about 5 minutes.  Onset was around lunchtime.  He states it does not feel like GERD.  He described the pain as sharp and stabbing like a kidney stone or gallstone.  The pain has completely resolved.  He denies having had any radiating pain, shortness of breath, diaphoresis, nausea, vomiting, fevers, or chills.  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  I doubt ACS based on reassuring labs and pain-free at this point.  sound  like she has had any loss of consciousness.  I doubt PE given his appropriate anticoagulation with Eliquis.  He is not tachycardic, hypoxic, nor hypotensive.  We discussed together that no certain cause of his symptoms was found on the additional workup will be needed on an outpatient basis.  I placed cardiology ambulatory with referral.  Patient will follow-up with his doctor on Tuesday as well.  Amount and/or Complexity of Data Reviewed Labs: ordered.    Details: Trop 15->12, pain free, doubt ACS Cr 1.31, which is a little higher that most recent.  Recommend recheck by PCP. No leukocytosis or significant anemia Radiology: ordered and independent  interpretation performed.    Details: No large opacity or effusion ECG/medicine tests: ordered and independent interpretation performed.    Details: PACs present         Consultants: No consultations were needed in caring for this patient.   Treatment and Plan: I considered admission due to patient's initial presentation, but after considering the examination and diagnostic results, patient will not require admission and can be discharged with outpatient follow-up.  Patient discussed with attending physician, Dr. Adela Lank, who agrees with plan.  Final Clinical Impressions(s) / ED Diagnoses     ICD-10-CM   1. Precordial pain  R07.2     2. Nonspecific chest pain  R07.9 Ambulatory referral to Cardiology      ED Discharge Orders          Ordered    Ambulatory referral to Cardiology       Comments: If you have not heard from the Cardiology office within the next 72 hours please call [phone].   11/16/22 2232              Discharge Instructions Discussed with and Provided to Patient:     Discharge Instructions      Your chest x-ray, blood work, and EKG revealed no emergent causes of chest pain.  It is uncertain what caused your symptoms and I recommend close follow-up with your doctor and with a cardiologist.  Continue taking your Eliquis as prescribed.  You can take your evening dose when you get home.       Nyheim, Chaffer, PA-C 11/16/22 2244    Melene Plan, DO 11/19/22 [phone]

## 2022-11-16 NOTE — ED Triage Notes (Signed)
Pt c/o non radiating midsternal chest pain that started yesterday, but has now resolved. Pt denies N/V, SOB

## 2022-11-16 NOTE — Discharge Instructions (Signed)
Your chest x-ray, blood work, and EKG revealed no emergent causes of chest pain.  It is uncertain what caused your symptoms and I recommend close follow-up with your doctor and with a cardiologist.  Continue taking your Eliquis as prescribed.  You can take your evening dose when you get home.

## 2022-11-17 ENCOUNTER — Other Ambulatory Visit: Payer: Self-pay

## 2022-11-20 DIAGNOSIS — I82412 Acute embolism and thrombosis of left femoral vein: Secondary | ICD-10-CM | POA: Diagnosis not present

## 2022-11-20 DIAGNOSIS — E559 Vitamin D deficiency, unspecified: Secondary | ICD-10-CM | POA: Diagnosis not present

## 2022-11-20 DIAGNOSIS — E291 Testicular hypofunction: Secondary | ICD-10-CM | POA: Diagnosis not present

## 2022-11-20 DIAGNOSIS — R748 Abnormal levels of other serum enzymes: Secondary | ICD-10-CM | POA: Diagnosis not present

## 2022-11-20 DIAGNOSIS — D751 Secondary polycythemia: Secondary | ICD-10-CM | POA: Diagnosis not present

## 2022-11-20 DIAGNOSIS — E7849 Other hyperlipidemia: Secondary | ICD-10-CM | POA: Diagnosis not present

## 2022-11-20 DIAGNOSIS — E538 Deficiency of other specified B group vitamins: Secondary | ICD-10-CM | POA: Diagnosis not present

## 2022-11-20 DIAGNOSIS — I868 Varicose veins of other specified sites: Secondary | ICD-10-CM | POA: Diagnosis not present

## 2022-11-20 DIAGNOSIS — I10 Essential (primary) hypertension: Secondary | ICD-10-CM | POA: Diagnosis not present

## 2022-11-20 DIAGNOSIS — I8390 Asymptomatic varicose veins of unspecified lower extremity: Secondary | ICD-10-CM | POA: Diagnosis not present

## 2022-11-20 DIAGNOSIS — M792 Neuralgia and neuritis, unspecified: Secondary | ICD-10-CM | POA: Diagnosis not present

## 2022-11-28 DIAGNOSIS — H2513 Age-related nuclear cataract, bilateral: Secondary | ICD-10-CM | POA: Diagnosis not present

## 2022-11-28 DIAGNOSIS — H524 Presbyopia: Secondary | ICD-10-CM | POA: Diagnosis not present

## 2022-11-28 DIAGNOSIS — H25013 Cortical age-related cataract, bilateral: Secondary | ICD-10-CM | POA: Diagnosis not present

## 2022-11-28 DIAGNOSIS — H5203 Hypermetropia, bilateral: Secondary | ICD-10-CM | POA: Diagnosis not present

## 2022-12-06 ENCOUNTER — Other Ambulatory Visit (HOSPITAL_COMMUNITY): Payer: Self-pay | Admitting: Internal Medicine

## 2022-12-06 DIAGNOSIS — E7849 Other hyperlipidemia: Secondary | ICD-10-CM

## 2022-12-12 ENCOUNTER — Ambulatory Visit (HOSPITAL_COMMUNITY)
Admission: RE | Admit: 2022-12-12 | Discharge: 2022-12-12 | Disposition: A | Discharge status: Home / Self Care | Payer: BLUE CROSS/BLUE SHIELD | Source: Ambulatory Visit | Attending: Internal Medicine | Admitting: Internal Medicine

## 2022-12-12 DIAGNOSIS — E7849 Other hyperlipidemia: Secondary | ICD-10-CM | POA: Insufficient documentation

## 2023-01-09 ENCOUNTER — Ambulatory Visit (HOSPITAL_BASED_OUTPATIENT_CLINIC_OR_DEPARTMENT_OTHER): Payer: Medicare Other | Admitting: Nurse Practitioner

## 2023-01-09 ENCOUNTER — Encounter (HOSPITAL_BASED_OUTPATIENT_CLINIC_OR_DEPARTMENT_OTHER): Payer: Self-pay

## 2023-01-09 ENCOUNTER — Encounter (HOSPITAL_BASED_OUTPATIENT_CLINIC_OR_DEPARTMENT_OTHER): Payer: Self-pay | Admitting: Nurse Practitioner

## 2023-01-09 VITALS — BP 126/70 | HR 74 | Ht 70.75 in | Wt 249.2 lb

## 2023-01-09 DIAGNOSIS — I493 Ventricular premature depolarization: Secondary | ICD-10-CM | POA: Diagnosis not present

## 2023-01-09 DIAGNOSIS — I77810 Thoracic aortic ectasia: Secondary | ICD-10-CM | POA: Diagnosis not present

## 2023-01-09 DIAGNOSIS — I82502 Chronic embolism and thrombosis of unspecified deep veins of left lower extremity: Secondary | ICD-10-CM | POA: Diagnosis not present

## 2023-01-09 DIAGNOSIS — E785 Hyperlipidemia, unspecified: Secondary | ICD-10-CM | POA: Diagnosis not present

## 2023-01-09 DIAGNOSIS — I447 Left bundle-branch block, unspecified: Secondary | ICD-10-CM

## 2023-01-09 DIAGNOSIS — R0989 Other specified symptoms and signs involving the circulatory and respiratory systems: Secondary | ICD-10-CM | POA: Diagnosis not present

## 2023-01-09 DIAGNOSIS — I2510 Atherosclerotic heart disease of native coronary artery without angina pectoris: Secondary | ICD-10-CM

## 2023-01-09 DIAGNOSIS — I10 Essential (primary) hypertension: Secondary | ICD-10-CM | POA: Diagnosis not present

## 2023-01-09 NOTE — Patient Instructions (Signed)
Medication Instructions:   Your physician recommends that you continue on your current medications as directed. Please refer to the Current Medication list given to you today.   *If you need a refill on your cardiac medications before your next appointment, please call your pharmacy*   Lab Work:  None ordered.  If you have labs (blood work) drawn today and your tests are completely normal, you will receive your results only by: MyChart Message (if you have MyChart) OR A paper copy in the mail If you have any lab test that is abnormal or we need to change your treatment, we will call you to review the results.   Testing/Procedures:  How to Prepare for Your Cardiac PET/CT Stress Test:  1. Please do not take these medications before your test:   Medications that may interfere with the cardiac pharmacological stress agent (ex. nitrates - including erectile dysfunction medications, isosorbide mononitrate- [please start to hold this medication the day before the test], tamulosin or beta-blockers) the day of the exam. (Erectile dysfunction medication should be held for at least 72 hrs prior to test)  Your remaining medications may be taken with water.  2. Nothing to eat or drink, except water, 3 hours prior to arrival time.   NO caffeine/decaffeinated products, or chocolate 12 hours prior to arrival.  3. NO  cologne or lotion on chest or abdomen area.         4. Total time is 1 to 2 hours; you may want to bring reading material for the waiting time.  5. Please report to Radiology at the [facility] Main Entrance 30 minutes early for your test.  [address] [place], [place] [address]  In preparation for your appointment, medication and supplies will be purchased.  Appointment availability is limited, so if you need to cancel or reschedule, please call the Radiology Department at [phone] [facility]) OR [phone] [facility])  24 hours in advance to avoid a  cancellation fee of $100.00  What to Expect After you Arrive:  Once you arrive and check in for your appointment, you will be taken to a preparation room within the Radiology Department.  A technologist or Nurse will obtain your medical history, verify that you are correctly prepped for the exam, and explain the procedure.  Afterwards,  an IV will be started in your arm and electrodes will be placed on your skin for EKG monitoring during the stress portion of the exam. Then you will be escorted to the PET/CT scanner.  There, staff will get you positioned on the scanner and obtain a blood pressure and EKG.  During the exam, you will continue to be connected to the EKG and blood pressure machines.  A small, safe amount of a radioactive tracer will be injected in your IV to obtain a series of pictures of your heart along with an injection of a stress agent.    After your Exam:  It is recommended that you eat a meal and drink a caffeinated beverage to counter act any effects of the stress agent.  Drink plenty of fluids for the remainder of the day and urinate frequently for the first couple of hours after the exam.  Your doctor will inform you of your test results within 7-10 business days.  For more information and frequently asked questions, please visit our website : [email or web address]  For questions about your test or how to prepare for your test, please call: Cardiac Imaging Nurse Navigators Office: [phone]  Your physician has requested that you have a carotid duplex. This test is an ultrasound of the carotid arteries in your neck. It looks at blood flow through these arteries that supply the brain with blood. Allow one hour for this exam. There are no restrictions or special instructions.   Follow-Up: At [facility], you and your health needs are our priority.  As part of our continuing mission to provide you with exceptional heart care, we have created designated  Provider Care Teams.  These Care Teams include your primary Cardiologist (physician) and Advanced Practice Providers (APPs -  Physician Assistants and Nurse Practitioners) who all work together to provide you with the care you need, when you need it.  We recommend signing up for the patient portal called "MyChart".  Sign up information is provided on this After Visit Summary.  MyChart is used to connect with patients for Virtual Visits (Telemedicine).  Patients are able to view lab/test results, encounter notes, upcoming appointments, etc.  Non-urgent messages can be sent to your provider as well.   To learn more about what you can do with MyChart, go to [email or web address].    Your next appointment:   2 month(s)  Provider:   Eligha Bridegroom, NP

## 2023-01-09 NOTE — Progress Notes (Signed)
Cardiology Office Note:  .   Date:  01/09/2023 ID:  Kevin Caldwell., DOB [birth year], MRN [id number] PCP: Kevin Aspen, MD [facility] Providers Cardiologist:  None   Patient Profile: .      PMH Elevated coronary artery calcium score CT calcium score 1595 (83rd percentile) LM 47, LAD 1030, LCx 15, RCA 503  Aortic atherosclerosis Ascending aortic aneurysm 40 mm Hypertension Obesity Vitamin B12 deficiency Family history Mother - stroke Father - CABG - died at age 23 y/o   Hyperlipidemia Left bundle branch block DVT Left leg DVT  Currently on 6 month therapy with Eliquis        History of Present Illness: .   Kevin Caldwell. is a very pleasant 77 y.o. male  who is here today for new patient consult for elevated coronary artery calcium score on CT calcium score. He is currently on atorvastatin for cholesterol management and Eliquis for the blood clot.  Plan is to continue until at least January 2025.  His cholesterol improved on atorvastatin and he asked to discontinue it. PCP recommended CT calcium score for further risk stratification. He reports that he walks for 1 hour daily for exercise. He thinks pace is good but admits he can have a conversation while walking. He denies chest pain, shortness of breath, orthopnea, PND, edema, presyncope, syncope, palpitations. His diet is mostly home-cooked meals with a focus on vegetables and lean proteins. History of high blood pressure on valsartan and amlodipine. His mother had a stroke and a heart attack in the past.  He has had a left bundle branch block for many years.   Family history: His family history includes Diabetes in his mother; Heart failure in his father; Stroke in his mother.   ASCVD Risk Score: Unable to calculate  Diet: Green leafy vegetables, peppers, more chicken less beef Admits that he frequently eats sweets  Recently stopped drinking soda, continues to drink sweet tea No  etoh  Activity: Walks for 1 hour 5 days per week  ROS: See HPI       Studies Reviewed: Marland Kitchen   EKG Interpretation Date/Time:  Wednesday January 09 2023 15:43:51 EST Ventricular Rate:  74 PR Interval:  184 QRS Duration:  136 QT Interval:  416 QTC Calculation: 461 R Axis:   19  Text Interpretation: Sinus rhythm with Premature supraventricular complexes Left bundle branch block When compared with ECG of 17-Nov-2022 00:07, PREVIOUS ECG IS PRESENT Confirmed by Eligha Bridegroom [phone]) on 01/09/2023 3:50:54 PM      Risk Assessment/Calculations:             Physical Exam:   VS: BP 126/70 (BP Location: Left Arm, Patient Position: Sitting, Cuff Size: Large)   Pulse 74   Ht 5' 10.75" (1.797 m)   Wt 249 lb 3.2 oz (113 kg)   SpO2 96%   BMI 35.00 kg/m   Wt Readings from Last 3 Encounters:  01/09/23 249 lb 3.2 oz (113 kg)  11/16/22 223 lb 1.7 oz (101.2 kg)  08/18/15 223 lb 3.2 oz (101.2 kg)     GEN: Obese, well developed in no acute distress NECK: No JVD; + bruit on right CARDIAC: IrregularRR, no murmurs, rubs, gallops RESPIRATORY:  Clear to auscultation without rales, wheezing or rhonchi  ABDOMEN: Soft, non-tender, non-distended EXTREMITIES:  No edema; No deformity     ASSESSMENT AND PLAN: .    Coronary artery disease: Coronary calcium score of 1595 (83rd percentile) on CT 12/12/2022. He  is active with walking and denies chest pain, shortness of breath, palpitations or other concerning symptoms with exertion. He is not on aspirin in the setting of OAC.  No prior ischemia evaluation.  Due to heavy distribution of calcium in LAD and RCA as well as some calcification in left main and LCx, we will get cardiac PET/CT for evaluation of ischemia.  Lengthy discussion about secondary prevention with focus on heart healthy mostly plant based diet avoiding saturated fat, processed foods, simple carbohydrates, and sugar along with aiming for at least 150 minutes of moderate intensity exercise each  week.  LDL 69, at goal of < 70.  Continue atorvastatin.  Hypertension: BP is well controlled. Low sodium diet encouraged.   DVT: History of left leg clot "the entire leg" per his report. Per his report, it appears it was unprovoked.   He is on Eliquis 5 mg twice daily until January 2025 at which time he will have completed 6 months of therapy. Encouraged follow-up with PCP.   Carotid bruit: Carotid bruit on right. We will get carotid duplex to evaluate for stenosis.  Continue atorvastatin for LDL goal 70 or lower.  Ascending aortic aneurysm: CT showed aortic diameter of 40mm, slightly larger than normal. No symptoms of dizziness or fainting. We reviewed precautions including avoiding heavy lifting and avoiding fluoroquinolone antibiotics.  We will plan to repeat imaging in 1 year.   Frequent PVCs: Irregular HR noted on exam.  EKG revealed sinus rhythm with left bundle branch block and frequent PVCs at 74 bpm. He is asymptomatic.  We will complete ischemia evaluation as noted above.  Consider addition of beta-Caldwell therapy if evidence of CAD or continued frequent PVCs.  Hyperlipidemia: Lab results from 10/31/2022 with total cholesterol 131, HDL 35, triglycerides 159, LDL-C 69.  We will continue atorvastatin 20 mg daily and consider repeat lipid panel along with lipoprotein a and apolipoprotein B at next office visit.   Plan/Goals: Continue to aim for 150 minutes of moderate intensity exercise each week Weight loss and mostly plant based diet avoiding saturated fat, simple carbohydrates, processed foods, and sugar     Informed Consent   Shared Decision Making/Informed Consent The risks [chest pain, shortness of breath, cardiac arrhythmias, dizziness, blood pressure fluctuations, myocardial infarction, stroke/transient ischemic attack, nausea, vomiting, allergic reaction, radiation exposure, metallic taste sensation and life-threatening complications (estimated to be 1 in 10,000)], benefits (risk  stratification, diagnosing coronary artery disease, treatment guidance) and alternatives of a cardiac PET stress test were discussed in detail with Kevin Caldwell and he agrees to proceed.     Dispo: 2-3 months with me  Signed, Eligha Bridegroom, NP-C

## 2023-01-11 ENCOUNTER — Ambulatory Visit (HOSPITAL_BASED_OUTPATIENT_CLINIC_OR_DEPARTMENT_OTHER): Payer: Medicare Other

## 2023-01-11 DIAGNOSIS — I2510 Atherosclerotic heart disease of native coronary artery without angina pectoris: Secondary | ICD-10-CM | POA: Diagnosis not present

## 2023-01-15 ENCOUNTER — Telehealth (HOSPITAL_COMMUNITY): Payer: Self-pay | Admitting: *Deleted

## 2023-01-15 DIAGNOSIS — I82412 Acute embolism and thrombosis of left femoral vein: Secondary | ICD-10-CM | POA: Diagnosis not present

## 2023-01-15 DIAGNOSIS — I7121 Aneurysm of the ascending aorta, without rupture: Secondary | ICD-10-CM | POA: Diagnosis not present

## 2023-01-15 DIAGNOSIS — I2510 Atherosclerotic heart disease of native coronary artery without angina pectoris: Secondary | ICD-10-CM | POA: Diagnosis not present

## 2023-01-15 DIAGNOSIS — R42 Dizziness and giddiness: Secondary | ICD-10-CM | POA: Diagnosis not present

## 2023-01-15 DIAGNOSIS — I10 Essential (primary) hypertension: Secondary | ICD-10-CM | POA: Diagnosis not present

## 2023-01-15 DIAGNOSIS — E7800 Pure hypercholesterolemia, unspecified: Secondary | ICD-10-CM | POA: Diagnosis not present

## 2023-01-15 NOTE — Telephone Encounter (Signed)
Reaching out to patient to offer assistance regarding upcoming cardiac imaging study; pt verbalizes understanding of appt date/time, parking situation and where to check in, pre-test NPO status and verified current allergies; name and call back number provided for further questions should they arise  Danette Weinfeld RN Navigator Cardiac Imaging [facility] [phone] office [phone] cell  Patient aware to avoid caffeine 12 hours prior to his cardiac PET scan. 

## 2023-01-16 ENCOUNTER — Encounter (HOSPITAL_COMMUNITY)
Admission: RE | Admit: 2023-01-16 | Discharge: 2023-01-16 | Disposition: A | Discharge status: Home / Self Care | Payer: Medicare Other | Source: Ambulatory Visit | Attending: Nurse Practitioner | Admitting: Nurse Practitioner

## 2023-01-16 DIAGNOSIS — I2510 Atherosclerotic heart disease of native coronary artery without angina pectoris: Secondary | ICD-10-CM | POA: Diagnosis not present

## 2023-01-16 MED ORDER — REGADENOSON 0.4 MG/5ML IV SOLN
0.4000 mg | Freq: Once | INTRAVENOUS | Status: AC
  Administered 2023-01-16: 0.4 mg via INTRAVENOUS

## 2023-01-16 MED ORDER — RUBIDIUM RB82 GENERATOR (RUBYFILL)
29.3600 | PACK | Freq: Once | INTRAVENOUS | Status: AC
  Administered 2023-01-16: 29.36 via INTRAVENOUS

## 2023-01-16 MED ORDER — REGADENOSON 0.4 MG/5ML IV SOLN
INTRAVENOUS | Status: AC
  Filled 2023-01-16: qty 5

## 2023-01-16 MED ORDER — RUBIDIUM RB82 GENERATOR (RUBYFILL)
29.4600 | PACK | Freq: Once | INTRAVENOUS | Status: AC
  Administered 2023-01-16: 29.46 via INTRAVENOUS

## 2023-01-17 LAB — NM PET CT CARDIAC PERFUSION MULTI W/ABSOLUTE BLOODFLOW
LV dias vol: 130 mL (ref 62–150)
LV sys vol: 47 mL
MBFR: 2.24
Nuc Rest EF: 60 %
Nuc Stress EF: 64 %
Peak HR: 85 {beats}/min
Rest HR: 63 {beats}/min
Rest MBF: 0.76 ml/g/min
Rest Nuclear Isotope Dose: 29.4 mCi
ST Depression (mm): 0 mm
Stress MBF: 1.7 ml/g/min
Stress Nuclear Isotope Dose: 29.5 mCi
TID: 1.01

## 2023-01-18 ENCOUNTER — Encounter: Payer: Self-pay | Admitting: Cardiovascular Disease

## 2023-01-18 ENCOUNTER — Ambulatory Visit: Payer: Medicare Other | Attending: Cardiovascular Disease | Admitting: Cardiovascular Disease

## 2023-01-18 VITALS — BP 112/78 | HR 81 | Ht 70.0 in | Wt 247.2 lb

## 2023-01-18 DIAGNOSIS — I2510 Atherosclerotic heart disease of native coronary artery without angina pectoris: Secondary | ICD-10-CM | POA: Insufficient documentation

## 2023-01-18 DIAGNOSIS — Z01812 Encounter for preprocedural laboratory examination: Secondary | ICD-10-CM | POA: Diagnosis not present

## 2023-01-18 NOTE — H&P (View-Only) (Signed)
Cardiology Office Note:  .   Date:  01/18/2023  ID:  Kevin Blocker., DOB 08/10/1945, MRN [id number] PCP: Emilio Aspen, MD  [facility] Providers Cardiologist:  None    History of Present Illness: .   Kevin Diego. is a 77 y.o. male with hx of left DVT, HTN,  On Eliquis for his  DVT    Seen with his son,  Kevin Caldwell .   Has had some CP ,  Was seen in the ER  Pain lasted several minutes.   Was his second episode Was dismissed from the ER    CAC score was performed - 1595  (83rd percentile)   Cardiac PET shows a fixed defect with surrounding by reversible defect   He walks regularly Spins regularly - at [employer]  Does have some pain in his shoulders - not related to exercise     Family hx   Mother had MI in 25s  Stroke at 53  Father :  CABG - in his 61s     ROS:    Studies Reviewed: Marland Kitchen   EKG Interpretation Date/Time:  Friday January 18 2023 14:51:46 EST Ventricular Rate:  69 PR Interval:  196 QRS Duration:  136 QT Interval:  438 QTC Calculation: 469 R Axis:   28  Text Interpretation: Sinus bradycardia with occasional Premature ventricular complexes and Premature atrial complexes Left bundle branch block When compared with ECG of 09-Jan-2023 15:43, Premature ventricular complexes are now Present Confirmed by Kristeen Miss ([id number]) on 01/18/2023 3:25:38 PM     Risk Assessment/Calculations:             Physical Exam:   VS:  BP 112/78   Pulse 81   Ht 5\' 10"  (1.778 m)   Wt 247 lb 3.2 oz (112.1 kg)   SpO2 97%   BMI 35.47 kg/m    Wt Readings from Last 3 Encounters:  01/18/23 247 lb 3.2 oz (112.1 kg)  01/09/23 249 lb 3.2 oz (113 kg)  11/16/22 223 lb 1.7 oz (101.2 kg)    GEN: Well nourished, well developed in no acute distress NECK: No JVD; No carotid bruits CARDIAC: RRR, no murmurs, rubs, gallops RESPIRATORY:  Clear to auscultation without rales, wheezing or rhonchi  ABDOMEN: Soft, non-tender, non-distended EXTREMITIES:  No  edema; No deformity   ASSESSMENT AND PLAN: .    Chest pain / possible unstable angina :   He has had CP several weeks ago  Was seen in the ER   Has CAC score of > 1500  Abnormal PET scan - apic   I have discussed cardiac cath - risks, benefits , options  He had many questions which we answered.  He agrees to have heart catheterization.    2.  CAC:       Informed Consent   Shared Decision Making/Informed Consent The risks [stroke (1 in 1000), death (1 in 1000), kidney failure [usually temporary] (1 in 500), bleeding (1 in 200), allergic reaction [possibly serious] (1 in 200)], benefits (diagnostic support and management of coronary artery disease) and alternatives of a cardiac catheterization were discussed in detail with Mr. Schmuck and he is willing to proceed.     Dispo: several months    Signed, Kristeen Miss, MD

## 2023-01-18 NOTE — H&P (View-Only) (Signed)
  Cardiology Office Note:  .   Date:  01/18/2023  ID:  Kevin Caldwell., DOB March 20, 1945, MRN [id number] PCP: Emilio Aspen, MD  [facility] Providers Cardiologist:  None    History of Present Illness: .   Kevin Caldwell. is a 77 y.o. male with hx of left DVT, HTN,  On Eliquis for his  DVT    Seen with his son,  Kevin Caldwell .   Has had some CP ,  Was seen in the ER  Pain lasted several minutes.   Was his second episode Was dismissed from the ER    CAC score was performed - 1595  (83rd percentile)   Cardiac PET shows a fixed defect with surrounding by reversible defect   He walks regularly Spins regularly - at [employer]  Does have some pain in his shoulders - not related to exercise     Family hx   Mother had MI in 68s  Stroke at 17  Father :  CABG - in his 56s     ROS:    Studies Reviewed: Marland Kitchen   EKG Interpretation Date/Time:  Friday January 18 2023 14:51:46 EST Ventricular Rate:  69 PR Interval:  196 QRS Duration:  136 QT Interval:  438 QTC Calculation: 469 R Axis:   28  Text Interpretation: Sinus bradycardia with occasional Premature ventricular complexes and Premature atrial complexes Left bundle branch block When compared with ECG of 09-Jan-2023 15:43, Premature ventricular complexes are now Present Confirmed by Kristeen Miss ([id number]) on 01/18/2023 3:25:38 PM     Risk Assessment/Calculations:             Physical Exam:   VS:  BP 112/78   Pulse 81   Ht 5\' 10"  (1.778 m)   Wt 247 lb 3.2 oz (112.1 kg)   SpO2 97%   BMI 35.47 kg/m    Wt Readings from Last 3 Encounters:  01/18/23 247 lb 3.2 oz (112.1 kg)  01/09/23 249 lb 3.2 oz (113 kg)  11/16/22 223 lb 1.7 oz (101.2 kg)    GEN: Well nourished, well developed in no acute distress NECK: No JVD; No carotid bruits CARDIAC: RRR, no murmurs, rubs, gallops RESPIRATORY:  Clear to auscultation without rales, wheezing or rhonchi  ABDOMEN: Soft, non-tender, non-distended EXTREMITIES:  No  edema; No deformity   ASSESSMENT AND PLAN: .    Chest pain / possible unstable angina :   He has had CP several weeks ago  Was seen in the ER   Has CAC score of > 1500  Abnormal PET scan - apic   I have discussed cardiac cath - risks, benefits , options  He had many questions which we answered.  He agrees to have heart catheterization.    2.  CAC:       Informed Consent   Shared Decision Making/Informed Consent The risks [stroke (1 in 1000), death (1 in 1000), kidney failure [usually temporary] (1 in 500), bleeding (1 in 200), allergic reaction [possibly serious] (1 in 200)], benefits (diagnostic support and management of coronary artery disease) and alternatives of a cardiac catheterization were discussed in detail with [name] and he is willing to proceed.     Dispo: several months    Signed, Kristeen Miss, MD

## 2023-01-18 NOTE — Progress Notes (Signed)
  Cardiology Office Note:  .   Date:  01/18/2023  ID:  [name], DOB March 20, 1945, MRN [id number] PCP: Emilio Aspen, MD  [facility] Providers Cardiologist:  None    History of Present Illness: .   Kevin Caldwell. is a 77 y.o. male with hx of left DVT, HTN,  On Eliquis for his  DVT    Seen with his son,  Kevin Caldwell .   Has had some CP ,  Was seen in the ER  Pain lasted several minutes.   Was his second episode Was dismissed from the ER    CAC score was performed - 1595  (83rd percentile)   Cardiac PET shows a fixed defect with surrounding by reversible defect   He walks regularly Spins regularly - at [employer]  Does have some pain in his shoulders - not related to exercise     Family hx   Mother had MI in 68s  Stroke at 17  Father :  CABG - in his 56s     ROS:    Studies Reviewed: Marland Kitchen   EKG Interpretation Date/Time:  Friday January 18 2023 14:51:46 EST Ventricular Rate:  69 PR Interval:  196 QRS Duration:  136 QT Interval:  438 QTC Calculation: 469 R Axis:   28  Text Interpretation: Sinus bradycardia with occasional Premature ventricular complexes and Premature atrial complexes Left bundle branch block When compared with ECG of 09-Jan-2023 15:43, Premature ventricular complexes are now Present Confirmed by Kristeen Miss ([id number]) on 01/18/2023 3:25:38 PM     Risk Assessment/Calculations:             Physical Exam:   VS:  BP 112/78   Pulse 81   Ht 5\' 10"  (1.778 m)   Wt 247 lb 3.2 oz (112.1 kg)   SpO2 97%   BMI 35.47 kg/m    Wt Readings from Last 3 Encounters:  01/18/23 247 lb 3.2 oz (112.1 kg)  01/09/23 249 lb 3.2 oz (113 kg)  11/16/22 223 lb 1.7 oz (101.2 kg)    GEN: Well nourished, well developed in no acute distress NECK: No JVD; No carotid bruits CARDIAC: RRR, no murmurs, rubs, gallops RESPIRATORY:  Clear to auscultation without rales, wheezing or rhonchi  ABDOMEN: Soft, non-tender, non-distended EXTREMITIES:  No  edema; No deformity   ASSESSMENT AND PLAN: .    Chest pain / possible unstable angina :   He has had CP several weeks ago  Was seen in the ER   Has CAC score of > 1500  Abnormal PET scan - apic   I have discussed cardiac cath - risks, benefits , options  He had many questions which we answered.  He agrees to have heart catheterization.    2.  CAC:       Informed Consent   Shared Decision Making/Informed Consent The risks [stroke (1 in 1000), death (1 in 1000), kidney failure [usually temporary] (1 in 500), bleeding (1 in 200), allergic reaction [possibly serious] (1 in 200)], benefits (diagnostic support and management of coronary artery disease) and alternatives of a cardiac catheterization were discussed in detail with [name] and he is willing to proceed.     Dispo: several months    Signed, Kristeen Miss, MD

## 2023-01-18 NOTE — Addendum Note (Signed)
Addended by: Vesta Mixer on: 01/18/2023 06:11 PM   Modules accepted: Orders

## 2023-01-18 NOTE — Patient Instructions (Signed)
Medication Instructions:   Your physician recommends that you continue on your current medications as directed. Please refer to the Current Medication list given to you today.  *If you need a refill on your cardiac medications before your next appointment, please call your pharmacy*   Lab Work:  TODAY--BMET AND CBC W DIFF  If you have labs (blood work) drawn today and your tests are completely normal, you will receive your results only by: MyChart Message (if you have MyChart) OR A paper copy in the mail If you have any lab test that is abnormal or we need to change your treatment, we will call you to review the results.    Testing/Procedures:        Cardiac/Peripheral Catheterization   You are scheduled for a Cardiac Catheterization on Monday, November 18 with Dr. Cristal Deer End.  1. Please arrive at the [facility] (Main Entrance A) at [facility]: [address] [place], [place] [address] at 8:30 AM (This time is 2 hour(s) before your procedure to ensure your preparation).   Free valet parking service is available. You will check in at ADMITTING. The support person will be asked to wait in the waiting room.  It is OK to have someone drop you off and come back when you are ready to be discharged.        Special note: Every effort is made to have your procedure done on time. Please understand that emergencies sometimes delay scheduled procedures.  2. Diet: Do not eat solid foods after midnight.  You may have clear liquids until 5 AM the day of the procedure.  3. Labs: You will need to have blood drawn on Friday, November 15 at [facility] at [facility]. [address], [place]  Open: 7:30am - 5pm    Phone: [phone]. You do not need to be fasting.  4. Medication instructions in preparation for your procedure:   Contrast Allergy: No    Stop taking Eliquis (Apixiban) on Saturday, November 16.  Stop taking, DIOVAN-hydrochlorothiazide   ON  Sunday, November 17,   On the morning of your procedure, take Aspirin 81 mg and any morning medicines NOT listed above.  You may use sips of water.  5. Plan to go home the same day, you will only stay overnight if medically necessary. 6. You MUST have a responsible adult to drive you home. 7. An adult MUST be with you the first 24 hours after you arrive home. 8. Bring a current list of your medications, and the last time and date medication taken. 9. Bring ID and current insurance cards. 10.Please wear clothes that are easy to get on and off and wear slip-on shoes.  Thank you for allowing [place] to care for you!   -- [facility]    Follow-Up:  2 MONTHS WITH Eligha Bridegroom NP

## 2023-01-19 LAB — CBC WITH DIFFERENTIAL/PLATELET
Basophils Absolute: 0 10*3/uL (ref 0.0–0.2)
Basos: 0 %
EOS (ABSOLUTE): 0.1 10*3/uL (ref 0.0–0.4)
Eos: 1 %
Hematocrit: 41.2 % (ref 37.5–51.0)
Hemoglobin: 13.8 g/dL (ref 13.0–17.7)
Immature Grans (Abs): 0 10*3/uL (ref 0.0–0.1)
Immature Granulocytes: 0 %
Lymphocytes Absolute: 2.2 10*3/uL (ref 0.7–3.1)
Lymphs: 31 %
MCH: 33.3 pg — ABNORMAL HIGH (ref 26.6–33.0)
MCHC: 33.5 g/dL (ref 31.5–35.7)
MCV: 99 fL — ABNORMAL HIGH (ref 79–97)
Monocytes Absolute: 0.6 10*3/uL (ref 0.1–0.9)
Monocytes: 9 %
Neutrophils Absolute: 4.1 10*3/uL (ref 1.4–7.0)
Neutrophils: 59 %
Platelets: 212 10*3/uL (ref 150–450)
RBC: 4.15 x10E6/uL (ref 4.14–5.80)
RDW: 13.4 % (ref 11.6–15.4)
WBC: 7 10*3/uL (ref 3.4–10.8)

## 2023-01-19 LAB — BASIC METABOLIC PANEL
BUN/Creatinine Ratio: 27 — ABNORMAL HIGH (ref 10–24)
BUN: 36 mg/dL — ABNORMAL HIGH (ref 8–27)
CO2: 21 mmol/L (ref 20–29)
Calcium: 10 mg/dL (ref 8.6–10.2)
Chloride: 105 mmol/L (ref 96–106)
Creatinine, Ser: 1.33 mg/dL — ABNORMAL HIGH (ref 0.76–1.27)
Glucose: 104 mg/dL — ABNORMAL HIGH (ref 70–99)
Potassium: 4.9 mmol/L (ref 3.5–5.2)
Sodium: 142 mmol/L (ref 134–144)
eGFR: 55 mL/min/{1.73_m2} — ABNORMAL LOW (ref >59)

## 2023-01-21 ENCOUNTER — Other Ambulatory Visit: Payer: Self-pay

## 2023-01-21 ENCOUNTER — Ambulatory Visit (HOSPITAL_COMMUNITY)
Admission: RE | Admit: 2023-01-21 | Discharge: 2023-01-21 | Disposition: A | Discharge status: Home / Self Care | Payer: Medicare Other | Attending: Internal Medicine | Admitting: Internal Medicine

## 2023-01-21 ENCOUNTER — Encounter (HOSPITAL_COMMUNITY)
Admission: RE | Disposition: A | Discharge status: Home / Self Care | Payer: Self-pay | Source: Home / Self Care | Attending: Internal Medicine

## 2023-01-21 DIAGNOSIS — Z86718 Personal history of other venous thrombosis and embolism: Secondary | ICD-10-CM | POA: Diagnosis not present

## 2023-01-21 DIAGNOSIS — I2510 Atherosclerotic heart disease of native coronary artery without angina pectoris: Secondary | ICD-10-CM | POA: Diagnosis not present

## 2023-01-21 DIAGNOSIS — R079 Chest pain, unspecified: Secondary | ICD-10-CM | POA: Diagnosis not present

## 2023-01-21 DIAGNOSIS — Z8249 Family history of ischemic heart disease and other diseases of the circulatory system: Secondary | ICD-10-CM | POA: Insufficient documentation

## 2023-01-21 DIAGNOSIS — E785 Hyperlipidemia, unspecified: Secondary | ICD-10-CM | POA: Insufficient documentation

## 2023-01-21 DIAGNOSIS — Z01812 Encounter for preprocedural laboratory examination: Secondary | ICD-10-CM

## 2023-01-21 DIAGNOSIS — I2584 Coronary atherosclerosis due to calcified coronary lesion: Secondary | ICD-10-CM | POA: Diagnosis not present

## 2023-01-21 DIAGNOSIS — R9439 Abnormal result of other cardiovascular function study: Secondary | ICD-10-CM | POA: Diagnosis present

## 2023-01-21 DIAGNOSIS — I10 Essential (primary) hypertension: Secondary | ICD-10-CM | POA: Diagnosis not present

## 2023-01-21 DIAGNOSIS — Z7901 Long term (current) use of anticoagulants: Secondary | ICD-10-CM | POA: Diagnosis not present

## 2023-01-21 HISTORY — PX: LEFT HEART CATH AND CORONARY ANGIOGRAPHY: CATH118249

## 2023-01-21 SURGERY — LEFT HEART CATH AND CORONARY ANGIOGRAPHY
Anesthesia: LOCAL

## 2023-01-21 MED ORDER — HEPARIN SODIUM (PORCINE) 1000 UNIT/ML IJ SOLN
INTRAMUSCULAR | Status: AC
  Filled 2023-01-21: qty 10

## 2023-01-21 MED ORDER — VERAPAMIL HCL 2.5 MG/ML IV SOLN
INTRAVENOUS | Status: AC
  Filled 2023-01-21: qty 2

## 2023-01-21 MED ORDER — VERAPAMIL HCL 2.5 MG/ML IV SOLN
INTRAVENOUS | Status: DC | PRN
  Administered 2023-01-21 (×2): 10 mL via INTRA_ARTERIAL

## 2023-01-21 MED ORDER — SODIUM CHLORIDE 0.9% FLUSH: 3.0000 mL | Freq: Two times a day (BID) | INTRAVENOUS | Status: DC

## 2023-01-21 MED ORDER — SODIUM CHLORIDE 0.9 % IV SOLN: 250.0000 mL | INTRAVENOUS | Status: DC | PRN

## 2023-01-21 MED ORDER — SODIUM CHLORIDE 0.9 % WEIGHT BASED INFUSION
3.0000 mL/kg/h | INTRAVENOUS | Status: AC
  Administered 2023-01-21: 3 mL/kg/h via INTRAVENOUS

## 2023-01-21 MED ORDER — MIDAZOLAM HCL 2 MG/2ML IJ SOLN
INTRAMUSCULAR | Status: DC | PRN
  Administered 2023-01-21: 1 mg via INTRAVENOUS

## 2023-01-21 MED ORDER — SODIUM CHLORIDE 0.9% FLUSH: 3.0000 mL | INTRAVENOUS | Status: DC | PRN

## 2023-01-21 MED ORDER — LABETALOL HCL 5 MG/ML IV SOLN: 10.0000 mg | INTRAVENOUS | Status: DC | PRN

## 2023-01-21 MED ORDER — SODIUM CHLORIDE 0.9 % WEIGHT BASED INFUSION: 1.0000 mL/kg/h | INTRAVENOUS | Status: DC

## 2023-01-21 MED ORDER — LIDOCAINE HCL (PF) 1 % IJ SOLN
INTRAMUSCULAR | Status: AC
  Filled 2023-01-21: qty 30

## 2023-01-21 MED ORDER — IOHEXOL 350 MG/ML SOLN
INTRAVENOUS | Status: DC | PRN
  Administered 2023-01-21: 80 mL

## 2023-01-21 MED ORDER — FENTANYL CITRATE (PF) 100 MCG/2ML IJ SOLN
INTRAMUSCULAR | Status: AC
  Filled 2023-01-21: qty 2

## 2023-01-21 MED ORDER — ASPIRIN 81 MG PO CHEW
81.0000 mg | CHEWABLE_TABLET | ORAL | Status: DC
Start: 2023-01-22 — End: 2023-01-21

## 2023-01-21 MED ORDER — ONDANSETRON HCL 4 MG/2ML IJ SOLN: 4.0000 mg | Freq: Four times a day (QID) | INTRAMUSCULAR | Status: DC | PRN

## 2023-01-21 MED ORDER — CLOPIDOGREL BISULFATE 300 MG PO TABS
ORAL_TABLET | ORAL | Status: AC
  Filled 2023-01-21: qty 2

## 2023-01-21 MED ORDER — ASPIRIN 81 MG PO TBEC: 81.0000 mg | DELAYED_RELEASE_TABLET | Freq: Every day | ORAL | Status: DC

## 2023-01-21 MED ORDER — FENTANYL CITRATE (PF) 100 MCG/2ML IJ SOLN
INTRAMUSCULAR | Status: DC | PRN
  Administered 2023-01-21: 25 ug via INTRAVENOUS

## 2023-01-21 MED ORDER — MIDAZOLAM HCL 2 MG/2ML IJ SOLN
INTRAMUSCULAR | Status: AC
  Filled 2023-01-21: qty 2

## 2023-01-21 MED ORDER — LIDOCAINE HCL (PF) 1 % IJ SOLN
INTRAMUSCULAR | Status: DC | PRN
  Administered 2023-01-21: 2 mL

## 2023-01-21 MED ORDER — SODIUM CHLORIDE 0.9 % IV SOLN
INTRAVENOUS | Status: DC
Start: 2023-01-21 — End: 2023-01-21

## 2023-01-21 MED ORDER — HYDRALAZINE HCL 20 MG/ML IJ SOLN: 10.0000 mg | INTRAMUSCULAR | Status: DC | PRN

## 2023-01-21 MED ORDER — NITROGLYCERIN 0.4 MG SL SUBL: 0.4000 mg | SUBLINGUAL_TABLET | SUBLINGUAL | 99 refills | Status: AC | PRN

## 2023-01-21 MED ORDER — ACETAMINOPHEN 325 MG PO TABS: 650.0000 mg | ORAL_TABLET | ORAL | Status: DC | PRN

## 2023-01-21 MED ORDER — HEPARIN SODIUM (PORCINE) 1000 UNIT/ML IJ SOLN
INTRAMUSCULAR | Status: DC | PRN
  Administered 2023-01-21: 5000 [IU] via INTRAVENOUS

## 2023-01-21 MED ORDER — HEPARIN (PORCINE) IN NACL 1000-0.9 UT/500ML-% IV SOLN
INTRAVENOUS | Status: DC | PRN
  Administered 2023-01-21 (×2): 500 mL

## 2023-01-21 SURGICAL SUPPLY — 13 items
CATH 5FR JL3.5 JR4 ANG PIG MP (CATHETERS) IMPLANT
CATH INFINITI 5 FR 3DRC (CATHETERS) IMPLANT
CATH LAUNCHER 6FR EBU3.5 (CATHETERS) IMPLANT
DEVICE RAD COMP TR BAND LRG (VASCULAR PRODUCTS) IMPLANT
GLIDESHEATH SLEND SS 6F .021 (SHEATH) IMPLANT
GUIDEWIRE ANGLED .035X150CM (WIRE) IMPLANT
GUIDEWIRE INQWIRE 1.5J.035X260 (WIRE) IMPLANT
INQWIRE 1.5J .035X260CM (WIRE) ×1
KIT ENCORE 26 ADVANTAGE (KITS) IMPLANT
PACK CARDIAC CATHETERIZATION (CUSTOM PROCEDURE TRAY) ×1 IMPLANT
SET ATX-X65L (MISCELLANEOUS) IMPLANT
SHEATH PROBE COVER 6X72 (BAG) IMPLANT
TUBING CIL FLEX 10 FLL-RA (TUBING) IMPLANT

## 2023-01-21 NOTE — Interval H&P Note (Signed)
History and Physical Interval Note:  01/21/2023 10:16 AM  Kevin Caldwell.  has presented today for surgery, with the diagnosis of abnormal stress test.  The various methods of treatment have been discussed with the patient and family. After consideration of risks, benefits and other options for treatment, the patient has consented to  Procedure(s): LEFT HEART CATH AND CORONARY ANGIOGRAPHY (N/A) as a surgical intervention.  The patient's history has been reviewed, patient examined, no change in status, stable for surgery.  I have reviewed the patient's chart and labs.  Questions were answered to the patient's satisfaction.    Cath Lab Visit (complete for each Cath Lab visit)  Clinical Evaluation Leading to the Procedure:   ACS: No.  Non-ACS:    Anginal Classification: CCS I  Anti-ischemic medical therapy: Minimal Therapy (1 class of medications)  Non-Invasive Test Results: Intermediate-risk stress test findings: cardiac mortality 1-3%/year  Prior CABG: No previous CABG  Kevin Caldwell

## 2023-01-21 NOTE — Discharge Instructions (Signed)
Radial Site Care  This sheet gives you information about how to care for yourself after your procedure. Your health care provider may also give you more specific instructions. If you have problems or questions, contact your health care provider. What can I expect after the procedure? After the procedure, it is common to have: Bruising and tenderness at the catheter insertion area. Follow these instructions at home: Medicines Take over-the-counter and prescription medicines only as told by your health care provider. Insertion site care Follow instructions from your health care provider about how to take care of your insertion site. Make sure you: Wash your hands with soap and water before you remove your bandage (dressing). If soap and water are not available, use hand sanitizer. May remove dressing in 24 hours. Check your insertion site every day for signs of infection. Check for: Redness, swelling, or pain. Fluid or blood. Pus or a bad smell. Warmth. Do no take baths, swim, or use a hot tub for 5 days. You may shower 24-48 hours after the procedure. Remove the dressing and gently wash the site with plain soap and water. Pat the area dry with a clean towel. Do not rub the site. That could cause bleeding. Do not apply powder or lotion to the site. Activity  For 24 hours after the procedure, or as directed by your health care provider: Do not flex or bend the affected arm. Do not push or pull heavy objects with the affected arm. Do not drive yourself home from the hospital or clinic. You may drive 24 hours after the procedure. Do not operate machinery or power tools. KEEP ARM ELEVATED THE REMAINDER OF THE DAY. Do not push, pull or lift anything that is heavier than 10 lb for 5 days. Ask your health care provider when it is okay to: Return to work or school. Resume usual physical activities or sports. Resume sexual activity. General instructions If the catheter site starts to  bleed, raise your arm and put firm pressure on the site. If the bleeding does not stop, get help right away. This is a medical emergency. DRINK PLENTY OF FLUIDS FOR THE NEXT 2-3 DAYS. No alcohol consumption for 24 hours after receiving sedation. If you went home on the same day as your procedure, a responsible adult should be with you for the first 24 hours after you arrive home. Keep all follow-up visits as told by your health care provider. This is important. Contact a health care provider if: You have a fever. You have redness, swelling, or yellow drainage around your insertion site. Get help right away if: You have unusual pain at the radial site. The catheter insertion area swells very fast. The insertion area is bleeding, and the bleeding does not stop when you hold steady pressure on the area. Your arm or hand becomes pale, cool, tingly, or numb. These symptoms may represent a serious problem that is an emergency. Do not wait to see if the symptoms will go away. Get medical help right away. Call your local emergency services (911 in the [place] Do not drive yourself to the hospital. Summary After the procedure, it is common to have bruising and tenderness at the site. Follow instructions from your health care provider about how to take care of your radial site wound. Check the wound every day for signs of infection.  This information is not intended to replace advice given to you by your health care provider. Make sure you discuss any questions you have with   your health care provider. Document Revised: 03/27/2017 Document Reviewed: 03/27/2017 Elsevier Patient Education  2020 [employer]  

## 2023-01-21 NOTE — Progress Notes (Signed)
Order placed for BMET prior to upcoming PCI procedure. Will call patient later today to inform.

## 2023-01-22 ENCOUNTER — Encounter (HOSPITAL_COMMUNITY): Payer: Self-pay | Admitting: Internal Medicine

## 2023-01-23 ENCOUNTER — Telehealth: Payer: Self-pay | Admitting: *Deleted

## 2023-01-23 NOTE — Telephone Encounter (Signed)
Coronary Stent scheduled at [facility] for: Monday January 28, 2023 7:30 AM Arrival time [facility] Main Entrance A at: 5:30 AM  Nothing to eat after midnight prior to procedure, clear liquids until 5 AM day of procedure.  Medication instructions: -Hold:  Eliquis-none starting 01/26/23 until post procedure  Valsartan-HCT-day before and AM of procedure -per protocol GFR <60 (55) -Other usual morning medications can be taken with sips of water including aspirin 81 mg.  Plan to go home the same day, you will only stay overnight if medically necessary.  You must have responsible adult to drive you home.  Someone must be with you the first 24 hours after you arrive home.  Reviewed procedure instructions with patient, he plans to get BMP 01/25/23.

## 2023-01-26 LAB — BASIC METABOLIC PANEL
BUN/Creatinine Ratio: 21 (ref 10–24)
BUN: 27 mg/dL (ref 8–27)
CO2: 22 mmol/L (ref 20–29)
Calcium: 10 mg/dL (ref 8.6–10.2)
Chloride: 101 mmol/L (ref 96–106)
Creatinine, Ser: 1.27 mg/dL (ref 0.76–1.27)
Glucose: 114 mg/dL — ABNORMAL HIGH (ref 70–99)
Potassium: 5.3 mmol/L — ABNORMAL HIGH (ref 3.5–5.2)
Sodium: 140 mmol/L (ref 134–144)
eGFR: 58 mL/min/{1.73_m2} — ABNORMAL LOW (ref >59)

## 2023-01-28 ENCOUNTER — Other Ambulatory Visit: Payer: Self-pay

## 2023-01-28 ENCOUNTER — Encounter (HOSPITAL_COMMUNITY)
Admission: RE | Disposition: A | Discharge status: Home / Self Care | Payer: Self-pay | Source: Home / Self Care | Attending: Internal Medicine

## 2023-01-28 ENCOUNTER — Observation Stay (HOSPITAL_COMMUNITY)
Admission: RE | Admit: 2023-01-28 | Discharge: 2023-01-29 | Disposition: A | Discharge status: Home / Self Care | Payer: Medicare Other | Attending: Internal Medicine | Admitting: Internal Medicine

## 2023-01-28 DIAGNOSIS — I10 Essential (primary) hypertension: Secondary | ICD-10-CM | POA: Insufficient documentation

## 2023-01-28 DIAGNOSIS — I25119 Atherosclerotic heart disease of native coronary artery with unspecified angina pectoris: Principal | ICD-10-CM | POA: Insufficient documentation

## 2023-01-28 DIAGNOSIS — E785 Hyperlipidemia, unspecified: Secondary | ICD-10-CM | POA: Insufficient documentation

## 2023-01-28 DIAGNOSIS — Z86718 Personal history of other venous thrombosis and embolism: Secondary | ICD-10-CM | POA: Insufficient documentation

## 2023-01-28 DIAGNOSIS — Z01818 Encounter for other preprocedural examination: Principal | ICD-10-CM

## 2023-01-28 DIAGNOSIS — Z955 Presence of coronary angioplasty implant and graft: Secondary | ICD-10-CM

## 2023-01-28 HISTORY — PX: CORONARY STENT INTERVENTION: CATH118234

## 2023-01-28 HISTORY — PX: CORONARY PRESSURE/FFR STUDY: CATH118243

## 2023-01-28 LAB — CBC
HCT: 38.2 % — ABNORMAL LOW (ref 39.0–52.0)
Hemoglobin: 12.8 g/dL — ABNORMAL LOW (ref 13.0–17.0)
MCH: 33 pg (ref 26.0–34.0)
MCHC: 33.5 g/dL (ref 30.0–36.0)
MCV: 98.5 fL (ref 80.0–100.0)
Platelets: 163 10*3/uL (ref 150–400)
RBC: 3.88 MIL/uL — ABNORMAL LOW (ref 4.22–5.81)
RDW: 12.9 % (ref 11.5–15.5)
WBC: 6.1 10*3/uL (ref 4.0–10.5)
nRBC: 0 % (ref 0.0–0.2)

## 2023-01-28 LAB — POCT ACTIVATED CLOTTING TIME
Activated Clotting Time: 250 s
Activated Clotting Time: 268 s
Activated Clotting Time: 273 s
Activated Clotting Time: 216 s
Activated Clotting Time: 198 s
Activated Clotting Time: 187 s
Activated Clotting Time: 164 s

## 2023-01-28 SURGERY — CORONARY STENT INTERVENTION
Anesthesia: LOCAL

## 2023-01-28 MED ORDER — CLOPIDOGREL BISULFATE 75 MG PO TABS
600.0000 mg | ORAL_TABLET | ORAL | Status: AC
  Administered 2023-01-28: 600 mg via ORAL
  Filled 2023-01-28: qty 8

## 2023-01-28 MED ORDER — SODIUM CHLORIDE 0.9% FLUSH
3.0000 mL | Freq: Two times a day (BID) | INTRAVENOUS | Status: DC
  Administered 2023-01-28: 3 mL via INTRAVENOUS

## 2023-01-28 MED ORDER — HEPARIN SODIUM (PORCINE) 1000 UNIT/ML IJ SOLN
INTRAMUSCULAR | Status: AC
  Filled 2023-01-28: qty 10

## 2023-01-28 MED ORDER — HEPARIN (PORCINE) IN NACL 1000-0.9 UT/500ML-% IV SOLN
INTRAVENOUS | Status: DC | PRN
  Administered 2023-01-28 (×2): 500 mL

## 2023-01-28 MED ORDER — ASPIRIN 81 MG PO CHEW: 81.0000 mg | CHEWABLE_TABLET | ORAL | Status: DC

## 2023-01-28 MED ORDER — SODIUM CHLORIDE 0.9 % IV SOLN: INTRAVENOUS | Status: AC

## 2023-01-28 MED ORDER — ASPIRIN 81 MG PO TBEC
81.0000 mg | DELAYED_RELEASE_TABLET | Freq: Every day | ORAL | Status: DC
  Administered 2023-01-29: 81 mg via ORAL
  Filled 2023-01-28: qty 1

## 2023-01-28 MED ORDER — NITROGLYCERIN 1 MG/10 ML FOR IR/CATH LAB
INTRA_ARTERIAL | Status: AC
  Filled 2023-01-28: qty 10

## 2023-01-28 MED ORDER — HYDRALAZINE HCL 20 MG/ML IJ SOLN: 10.0000 mg | INTRAMUSCULAR | Status: AC | PRN

## 2023-01-28 MED ORDER — HEPARIN SODIUM (PORCINE) 1000 UNIT/ML IJ SOLN
INTRAMUSCULAR | Status: DC | PRN
  Administered 2023-01-28: 3000 [IU] via INTRAVENOUS
  Administered 2023-01-28: 2000 [IU] via INTRAVENOUS
  Administered 2023-01-28: 3000 [IU] via INTRAVENOUS
  Administered 2023-01-28: 10000 [IU] via INTRAVENOUS

## 2023-01-28 MED ORDER — MIDAZOLAM HCL 2 MG/2ML IJ SOLN
INTRAMUSCULAR | Status: AC
Start: 2023-01-28 — End: ?
  Filled 2023-01-28: qty 2

## 2023-01-28 MED ORDER — LABETALOL HCL 5 MG/ML IV SOLN: 10.0000 mg | INTRAVENOUS | Status: AC | PRN

## 2023-01-28 MED ORDER — SODIUM CHLORIDE 0.9 % WEIGHT BASED INFUSION: 1.0000 mL/kg/h | INTRAVENOUS | Status: DC

## 2023-01-28 MED ORDER — AMLODIPINE BESYLATE 5 MG PO TABS
5.0000 mg | ORAL_TABLET | Freq: Every day | ORAL | Status: DC
  Administered 2023-01-29: 5 mg via ORAL
  Filled 2023-01-28: qty 1

## 2023-01-28 MED ORDER — LIDOCAINE HCL (PF) 1 % IJ SOLN
INTRAMUSCULAR | Status: DC | PRN
  Administered 2023-01-28: 15 mL

## 2023-01-28 MED ORDER — ATORVASTATIN CALCIUM 40 MG PO TABS
40.0000 mg | ORAL_TABLET | Freq: Every day | ORAL | Status: DC
  Filled 2023-01-28 (×2): qty 1

## 2023-01-28 MED ORDER — MIDAZOLAM HCL 2 MG/2ML IJ SOLN
INTRAMUSCULAR | Status: DC | PRN
  Administered 2023-01-28: 1 mg via INTRAVENOUS

## 2023-01-28 MED ORDER — NITROGLYCERIN 0.4 MG SL SUBL: 0.4000 mg | SUBLINGUAL_TABLET | SUBLINGUAL | Status: DC | PRN

## 2023-01-28 MED ORDER — ONDANSETRON HCL 4 MG/2ML IJ SOLN: 4.0000 mg | Freq: Four times a day (QID) | INTRAMUSCULAR | Status: DC | PRN

## 2023-01-28 MED ORDER — NITROGLYCERIN 1 MG/10 ML FOR IR/CATH LAB
INTRA_ARTERIAL | Status: DC | PRN
  Administered 2023-01-28: 200 ug via INTRACORONARY
  Administered 2023-01-28: 100 ug via INTRACORONARY
  Administered 2023-01-28 (×2): 200 ug via INTRACORONARY

## 2023-01-28 MED ORDER — IOHEXOL 350 MG/ML SOLN
INTRAVENOUS | Status: DC | PRN
  Administered 2023-01-28: 126 mL

## 2023-01-28 MED ORDER — LIDOCAINE HCL (PF) 1 % IJ SOLN
INTRAMUSCULAR | Status: AC
  Filled 2023-01-28: qty 30

## 2023-01-28 MED ORDER — SODIUM CHLORIDE 0.9 % IV SOLN: 250.0000 mL | INTRAVENOUS | Status: DC | PRN

## 2023-01-28 MED ORDER — FENTANYL CITRATE (PF) 100 MCG/2ML IJ SOLN
INTRAMUSCULAR | Status: DC | PRN
  Administered 2023-01-28: 50 ug via INTRAVENOUS

## 2023-01-28 MED ORDER — INFLUENZA VAC A&B SURF ANT ADJ 0.5 ML IM SUSY
0.5000 mL | PREFILLED_SYRINGE | INTRAMUSCULAR | Status: DC
  Filled 2023-01-28: qty 0.5

## 2023-01-28 MED ORDER — SODIUM CHLORIDE 0.9% FLUSH: 3.0000 mL | INTRAVENOUS | Status: DC | PRN

## 2023-01-28 MED ORDER — SODIUM CHLORIDE 0.9 % WEIGHT BASED INFUSION
3.0000 mL/kg/h | INTRAVENOUS | Status: DC
  Administered 2023-01-28: 3 mL/kg/h via INTRAVENOUS

## 2023-01-28 MED ORDER — ACETAMINOPHEN 325 MG PO TABS: 650.0000 mg | ORAL_TABLET | ORAL | Status: DC | PRN

## 2023-01-28 MED ORDER — FENTANYL CITRATE (PF) 100 MCG/2ML IJ SOLN
INTRAMUSCULAR | Status: AC
  Filled 2023-01-28: qty 2

## 2023-01-28 MED ORDER — CLOPIDOGREL BISULFATE 75 MG PO TABS
75.0000 mg | ORAL_TABLET | Freq: Every day | ORAL | Status: DC
  Administered 2023-01-29: 75 mg via ORAL
  Filled 2023-01-28: qty 1

## 2023-01-28 SURGICAL SUPPLY — 22 items
BALLN EMERGE MR 2.25X8 (BALLOONS) ×1
BALLN ~~LOC~~ EMERGE MR 2.75X8 (BALLOONS) ×1
BALLN ~~LOC~~ EUPHORA RX 2.75X8 (BALLOONS) ×1
BALLOON EMERGE MR 2.25X8 (BALLOONS) IMPLANT
BALLOON ~~LOC~~ EMERGE MR 2.75X8 (BALLOONS) IMPLANT
BALLOON ~~LOC~~ EUPHORA RX 2.75X8 (BALLOONS) IMPLANT
CATH LAUNCHER 6FR JR4 (CATHETERS) IMPLANT
CATH VISTA GUIDE 6FR XBLAD3.5 (CATHETERS) IMPLANT
ELECT DEFIB PAD ADLT CADENCE (PAD) IMPLANT
GUIDEWIRE PRESSURE X 175 (WIRE) IMPLANT
KIT ENCORE 26 ADVANTAGE (KITS) IMPLANT
KIT MICROPUNCTURE NIT STIFF (SHEATH) IMPLANT
KIT SYRINGE INJ CVI SPIKEX1 (MISCELLANEOUS) IMPLANT
PACK CARDIAC CATHETERIZATION (CUSTOM PROCEDURE TRAY) ×1 IMPLANT
SET ATX-X65L (MISCELLANEOUS) IMPLANT
SHEATH PINNACLE 6F 10CM (SHEATH) IMPLANT
SHEATH PROBE COVER 6X72 (BAG) IMPLANT
STENT SYNERGY XD 2.75X12 (Permanent Stent) IMPLANT
SYNERGY XD 2.75X12 (Permanent Stent) ×1 IMPLANT
WIRE EMERALD 3MM-J .035X150CM (WIRE) IMPLANT
WIRE MICROINTRODUCER 60CM (WIRE) IMPLANT
WIRE RUNTHROUGH .014X180CM (WIRE) IMPLANT

## 2023-01-28 NOTE — Plan of Care (Signed)
Problem: Education: Goal: Understanding of CV disease, CV risk reduction, and recovery process will improve Outcome: Progressing Goal: Individualized Educational Video(s) Outcome: Progressing   Problem: Activity: Goal: Ability to return to baseline activity level will improve Outcome: Progressing

## 2023-01-28 NOTE — Progress Notes (Signed)
SITE AREA: right groin/femoral  SITE PRIOR TO REMOVAL:  LEVEL 0  PRESSURE APPLIED FOR: approximately 30 minutes, sheath removed by Irving Burton, 2H RN  MANUAL: yes  PATIENT STATUS DURING PULL: stable  POST PULL SITE:  LEVEL 0  POST PULL INSTRUCTIONS GIVEN: yes, drsg x 24 hours, no sitting in water x 1 week, no hot tubs, bathtubs, or pools, may shower tomorrow, take it easy climbing into and out of vehicles and on stairs   POST PULL PULSES PRESENT:  bilateral pedal pulses at +2  DRESSING APPLIED: gauze with tegaderm  BEDREST BEGINS @1451   COMMENTS:

## 2023-01-28 NOTE — Interval H&P Note (Signed)
History and Physical Interval Note:  01/28/2023 7:11 AM  Kevin Caldwell.  has presented today for surgery, with the diagnosis of Coronary artery disease and abnormal myocardial PET/CT stress test.  The various methods of treatment have been discussed with the patient and family. After consideration of risks, benefits and other options for treatment, the patient has consented to  Procedure(s): CORONARY STENT INTERVENTION (N/A) as a surgical intervention.  The patient's history has been reviewed, patient examined, no change in status, stable for surgery.  I have reviewed the patient's chart and labs.  Questions were answered to the patient's satisfaction.    Cath Lab Visit (complete for each Cath Lab visit)  Clinical Evaluation Leading to the Procedure:   ACS: No.  Non-ACS:    Anginal Classification: CCS IV  Anti-ischemic medical therapy: Minimal Therapy (1 class of medications)  Non-Invasive Test Results: Intermediate-risk stress test findings: cardiac mortality 1-3%/year  Prior CABG: No previous CABG  Kevin Caldwell

## 2023-01-29 ENCOUNTER — Other Ambulatory Visit (HOSPITAL_COMMUNITY): Payer: Self-pay

## 2023-01-29 ENCOUNTER — Encounter (HOSPITAL_COMMUNITY): Payer: Self-pay | Admitting: Internal Medicine

## 2023-01-29 DIAGNOSIS — E785 Hyperlipidemia, unspecified: Secondary | ICD-10-CM | POA: Insufficient documentation

## 2023-01-29 DIAGNOSIS — Z86718 Personal history of other venous thrombosis and embolism: Secondary | ICD-10-CM | POA: Diagnosis not present

## 2023-01-29 DIAGNOSIS — I25119 Atherosclerotic heart disease of native coronary artery with unspecified angina pectoris: Secondary | ICD-10-CM | POA: Diagnosis not present

## 2023-01-29 DIAGNOSIS — I10 Essential (primary) hypertension: Secondary | ICD-10-CM | POA: Insufficient documentation

## 2023-01-29 LAB — CBC
HCT: 36 % — ABNORMAL LOW (ref 39.0–52.0)
Hemoglobin: 12.1 g/dL — ABNORMAL LOW (ref 13.0–17.0)
MCH: 33.2 pg (ref 26.0–34.0)
MCHC: 33.6 g/dL (ref 30.0–36.0)
MCV: 98.6 fL (ref 80.0–100.0)
Platelets: 158 10*3/uL (ref 150–400)
RBC: 3.65 MIL/uL — ABNORMAL LOW (ref 4.22–5.81)
RDW: 12.6 % (ref 11.5–15.5)
WBC: 6.9 10*3/uL (ref 4.0–10.5)
nRBC: 0 % (ref 0.0–0.2)

## 2023-01-29 LAB — BASIC METABOLIC PANEL
Anion gap: 8 (ref 5–15)
BUN: 26 mg/dL — ABNORMAL HIGH (ref 8–23)
CO2: 23 mmol/L (ref 22–32)
Calcium: 8.9 mg/dL (ref 8.9–10.3)
Chloride: 108 mmol/L (ref 98–111)
Creatinine, Ser: 1.25 mg/dL — ABNORMAL HIGH (ref 0.61–1.24)
GFR, Estimated: 59 mL/min — ABNORMAL LOW (ref >60)
Glucose, Bld: 108 mg/dL — ABNORMAL HIGH (ref 70–99)
Potassium: 4.6 mmol/L (ref 3.5–5.1)
Sodium: 139 mmol/L (ref 135–145)

## 2023-01-29 MED ORDER — CLOPIDOGREL BISULFATE 75 MG PO TABS
75.0000 mg | ORAL_TABLET | Freq: Every day | ORAL | 1 refills | Status: DC
  Filled 2023-01-29: qty 30, 30d supply, fill #0

## 2023-01-29 MED ORDER — ATORVASTATIN CALCIUM 40 MG PO TABS
40.0000 mg | ORAL_TABLET | Freq: Every day | ORAL | 1 refills | Status: DC
  Filled 2023-01-29: qty 30, 30d supply, fill #0

## 2023-01-29 MED ORDER — ASPIRIN 81 MG PO TBEC: 81.0000 mg | DELAYED_RELEASE_TABLET | Freq: Every day | ORAL | 0 refills | Status: AC

## 2023-01-29 NOTE — Progress Notes (Signed)
CARDIAC REHAB PHASE I    Pt resting in bed, feeling well today. Pt has ambulated independently in room and to bathroom. Tolerating well with no CP,SOB or dizziness. Pt reports mild chest soreness off and on overnight and this morning. Soreness is not exacerbated by activity. Post stent education including site care, restrictions, risk factors, exercise guidelines, NTG use, antiplatelet therapy importance, heart healthy diet and CRP2 reviewed. All questions and concerns addressed. Will refer to [facility] for CRP2. Plan for home later today.    [id number] Woodroe Chen, RN BSN 01/29/2023 9:31 AM

## 2023-01-29 NOTE — Discharge Summary (Addendum)
Discharge Summary    Patient ID: Kevin Caldwell. MRN: [id number]; DOB: 1946/01/21  Admit date: 01/28/2023 Discharge date: 01/29/2023  PCP:  Kevin Aspen, MD   [facility] Providers Cardiologist:  Kevin Miss, MD    Discharge Diagnoses    Principal Problem:   Coronary artery disease involving native coronary artery of native heart with angina pectoris [facility]) Active Problems:   Hyperlipidemia   Hypertension  Diagnostic Studies/Procedures    Cath: 01/28/2023  Conclusions: Two-vessel coronary artery disease, similar to last week's diagnostic angiogram.  70 to 80% proximal RCA and 60% mid RCA lesions are not hemodynamically significant (RFR = 0.90). Successful PCI to 80-90% mid LAD stenosis using Synergy 2.75 x 12 mm drug-eluting stent with 0% residual stenosis and TIMI-3 flow.  Plaque shift into the ostium of D2 occurred with 70% stenosis but preservation of TIMI-3 flow.  Intervention was not attempted on D2.   Recommendations: Remove right femoral artery sheath with manual compression when ACT has fallen below 175 seconds. Overnight observation. If no evidence of bleeding or vascular injury, anticipate resumption of apixaban 5 mg twice daily tomorrow.  Recommend continuation of aspirin, clopidogrel, and apixaban for 1 week, after which time aspirin can be discontinued and clopidogrel and apixaban maintained.  If apixaban is discontinued in January (as anticipated), aspirin 81 mg daily should be resumed with intention of completing at least 6 months of dual antiplatelet therapy. Aggressive secondary prevention of coronary artery disease.  I will increase atorvastatin to 40 mg daily and check a lipid panel with tomorrow morning's labs.   Kevin Kendall, MD [facility]  Diagnostic Dominance: Right  Intervention   _____________   History of Present Illness     Kevin Caldwell. is a 77 y.o. male with PMH of DVT, HTN, HLD who presented to the  office on 11/6 for new patient consult for elevated coronary artery calcium score on CT calcium score. He is currently on atorvastatin for cholesterol management and Eliquis for the blood clot.  Plan was to continue until at least January 2025.  His cholesterol improved on atorvastatin and he asked to discontinue it. PCP recommended CT calcium score for further risk stratification. He reported that he walks for 1 hour daily for exercise. He felt his pace was good but admitted he can have a conversation while walking. He denied chest pain, shortness of breath, orthopnea, PND, edema, presyncope, syncope, palpitations. His diet mostly home-cooked meals with a focus on vegetables and lean proteins. History of high blood pressure on valsartan and amlodipine. His mother had a stroke and a heart attack in the past.  He has had a left bundle branch block for many years. It was recommended that he undergo outpatient cardiac PET showed a fixed defect with surrounded by reversible defect. He was set up for outpatient cardiac cath.   Hospital Course     Underwent cardiac cath with Dr. Okey Dupre on 11/25 showing 2v CAD 70 to 80% proximal RCA and 60% mid RCA lesions are not hemodynamically significant (RFR = 0.90). Successful PCI to 80-90% mLAD with DES x1, plaque shift into the ostium of D2 occurred with 70% stenosis bu TIMI 3 flow, therefore intervention was not completed on D2. He was placed on ASA, plavix with recommendations to resume Eliquis. Plan for triple therapy with ASA, plavix, Eliquis for one week, then stop ASA. If Eliquis is stopped 03/2023, then would resume ASA. No complications noted overnight. Atorvastatin was increased from 20  to 40mg  daily.   General: Well developed, well nourished, male appearing in no acute distress. Head: Normocephalic, atraumatic.  Neck: Supple without bruits, JVD. Lungs:  Resp regular and unlabored, CTA. Heart: RRR, S1, S2, no S3, S4, or murmur; no rub. Abdomen: Soft, non-tender,  non-distended with normoactive bowel sounds. No hepatomegaly. No rebound/guarding. No obvious abdominal masses. Extremities: No clubbing, cyanosis, edema. Distal pedal pulses are 2+ bilaterally. Right femoral cath site stable without bruising or hematoma Neuro: Alert and oriented X 3. Moves all extremities spontaneously. Psych: Normal affect.  Did the patient have an acute coronary syndrome (MI, NSTEMI, STEMI, etc) this admission?:  No                               Did the patient have a percutaneous coronary intervention (stent / angioplasty)?:  Yes.    Cath/PCI Registry Performance & Quality Measures: Aspirin prescribed? - Yes ADP Receptor Inhibitor (Plavix/Clopidogrel, Brilinta/Ticagrelor or Effient/Prasugrel) prescribed (includes medically managed patients)? - Yes High Intensity Statin (Lipitor 40-80mg  or Crestor 20-40mg ) prescribed? - Yes For EF <40%, was ACEI/ARB prescribed? - Not Applicable (EF >/= 40%) For EF <40%, Aldosterone Antagonist (Spironolactone or Eplerenone) prescribed? - Not Applicable (EF >/= 40%) Cardiac Rehab Phase II ordered? - Yes  ____________  Discharge Vitals Blood pressure 113/74, pulse (!) 56, temperature 98.8 F (37.1 C), temperature source Oral, resp. rate 20, height 5\' 10"  (1.778 m), weight 111.1 kg, SpO2 97%.  Filed Weights   01/28/23 0550  Weight: 111.1 kg    Labs & Radiologic Studies    CBC Recent Labs    01/28/23 0621 01/29/23 0458  WBC 6.1 6.9  HGB 12.8* 12.1*  HCT 38.2* 36.0*  MCV 98.5 98.6  PLT 163 158   Basic Metabolic Panel Recent Labs    40/98/11 0458  NA 139  K 4.6  CL 108  CO2 23  GLUCOSE 108*  BUN 26*  CREATININE 1.25*  CALCIUM 8.9   Liver Function Tests No results for input(s): "AST", "ALT", "ALKPHOS", "BILITOT", "PROT", "ALBUMIN" in the last 72 hours. No results for input(s): "LIPASE", "AMYLASE" in the last 72 hours. High Sensitivity Troponin:   No results for input(s): "TROPONINIHS" in the last 720 hours.   BNP Invalid input(s): "POCBNP" D-Dimer No results for input(s): "DDIMER" in the last 72 hours. Hemoglobin A1C No results for input(s): "HGBA1C" in the last 72 hours. Fasting Lipid Panel No results for input(s): "CHOL", "HDL", "LDLCALC", "TRIG", "CHOLHDL", "LDLDIRECT" in the last 72 hours. Thyroid Function Tests No results for input(s): "TSH", "T4TOTAL", "T3FREE", "THYROIDAB" in the last 72 hours.  Invalid input(s): "FREET3" _____________  CARDIAC CATHETERIZATION  Result Date: 01/28/2023 Conclusions: Two-vessel coronary artery disease, similar to last week's diagnostic angiogram.  70 to 80% proximal RCA and 60% mid RCA lesions are not hemodynamically significant (RFR = 0.90). Successful PCI to 80-90% mid LAD stenosis using Synergy 2.75 x 12 mm drug-eluting stent with 0% residual stenosis and TIMI-3 flow.  Plaque shift into the ostium of D2 occurred with 70% stenosis but preservation of TIMI-3 flow.  Intervention was not attempted on D2. Recommendations: Remove right femoral artery sheath with manual compression when ACT has fallen below 175 seconds. Overnight observation. If no evidence of bleeding or vascular injury, anticipate resumption of apixaban 5 mg twice daily tomorrow.  Recommend continuation of aspirin, clopidogrel, and apixaban for 1 week, after which time aspirin can be discontinued and clopidogrel and apixaban maintained.  If  apixaban is discontinued in January (as anticipated), aspirin 81 mg daily should be resumed with intention of completing at least 6 months of dual antiplatelet therapy. Aggressive secondary prevention of coronary artery disease.  I will increase atorvastatin to 40 mg daily and check a lipid panel with tomorrow morning's labs. Kevin Kendall, MD [facility]  CARDIAC CATHETERIZATION  Result Date: 01/21/2023 Conclusions: Significant two-vessel coronary artery disease including heavily calcified LAD with sequential 50% proximal and 80-90% mid vessel stenoses  and 70-80% proximal RCA lesion followed by 60% mid and 50% distal stenoses.  OM2 with 40% lesion as well. Normal left ventricular systolic function (LVEF 55-65%) with upper normal filling pressure (LVEDP 15 mmHg). High takeoff of the right radial artery as well as tortuous right subclavian artery limiting diagnostic catheter manipulation and preventing advancement of a 60F guide catheter into the ascending aorta.  Recommend alternative access for future catheterizations. Recommendations: Plan for staged PCI to the LAD using alternative access.  Procedure tentatively scheduled for 01/28/2023. If no evidence of bleeding or vascular complication at right radial arteriotomy site, resume apixaban tomorrow.  Apixaban will need to be held for 2 days prior to next week's PCI. Aggressive secondary prevention of coronary artery disease.  Will add aspirin 81 mg daily pending PCI and load with clopidogrel when the patient arrives for catheterization next week.  Anticipate triple therapy with aspirin, clopidogrel, and apixaban for 1 week after intervention followed by at least 6 months of apixaban and clopidogrel (alternatively aspirin and clopidogrel if apixaban can be discontinued sooner). Repeat BMP at the end of this week to ensure stable renal function. Findings and recommendations were discussed with Mr. Tillis, his wife, and Dr. Elease Hashimoto. Kevin Kendall, MD [facility]  NM PET CT CARDIAC PERFUSION MULTI W/ABSOLUTE BLOODFLOW  Result Date: 01/17/2023   Findings are consistent with apical infarction with peri-infarct ischemia. The study is overall low-intermediate risk due to normal MBFR, no TID, and no significant ischemia with overall small distal territory of infarct.   LV perfusion is abnormal. There is evidence of ischemia. There is evidence of infarction. Defect 1: There is a medium defect with moderate reduction in uptake present in the apical inferior, septal and apex location(s) that is partially reversible.  There is abnormal wall motion in the defect area. Consistent with infarction and peri-infarct ischemia.   Rest left ventricular function is normal. Rest EF: 60%. Stress left ventricular function is normal. Stress EF: 64%. End diastolic cavity size is mildly enlarged. End systolic cavity size is normal. No evidence of transient ischemic dilation (TID) noted.   Myocardial blood flow was computed to be 0.18ml/g/min at rest and 1.69ml/g/min at stress. Global myocardial blood flow reserve was 2.24 and was normal.   Coronary calcium was present on the attenuation correction CT images. Severe coronary calcifications were present. Coronary calcifications were present in the left anterior descending artery, left circumflex artery and right coronary artery distribution(s). CLINICAL DATA:  This over-read does not include interpretation of cardiac or coronary anatomy or pathology. The Cardiac PET CT interpretation by the cardiologist is attached. COMPARISON:  CT chest coronary calcium scoring, 12/12/2022 FINDINGS: Cardiovascular: No significant vascular findings. Mild cardiomegaly. Left and right coronary artery calcifications and or stents. No pericardial effusion. Limited Mediastinum/Nodes: No enlarged mediastinal, hilar, or axillary lymph nodes. Trachea and esophagus demonstrate no significant findings. Limited Lungs/Pleura: Unchanged, definitively benign nodule of the central right middle lobe, requiring no further follow-up or characterization (series 4, image 40). No pleural effusion or pneumothorax.  Upper Abdomen: No acute abnormality.  Hepatic steatosis. Musculoskeletal: No chest wall abnormality. No acute osseous findings. IMPRESSION: 1. No acute CT findings of the chest. 2. Coronary artery disease. 3. Hepatic steatosis. Electronically Signed   By: Jearld Lesch M.D.   On: 01/16/2023 12:16  VAS US CAROTID  Result Date: 01/12/2023 Carotid Arterial Duplex Study Patient Name:  Pepper Ertman.  Date of Exam:    01/11/2023 Medical Rec #: [id number]           Accession #:    [id number] Date of Birth: 05-03-1945           Patient Gender: M Patient Age:   67 years Exam Location:  [facility] Procedure:      VAS US CAROTID Referring Phys: Eligha Bridegroom --------------------------------------------------------------------------------  Indications:       Right bruit and patient denies any cerebrovascular symptoms. Risk Factors:      Hypertension, hyperlipidemia, no history of smoking. Other Factors:     Ascending aortic aneurysm 40 mm. Comparison Study:  NA Performing Technologist: Jeryl Columbia RDCS  Examination Guidelines: A complete evaluation includes B-mode imaging, spectral Doppler, color Doppler, and power Doppler as needed of all accessible portions of each vessel. Bilateral testing is considered an integral part of a complete examination. Limited examinations for reoccurring indications may be performed as noted.  Right Carotid Findings: +----------+--------+--------+--------+------------------+--------+           PSV cm/sEDV cm/sStenosisPlaque DescriptionComments +----------+--------+--------+--------+------------------+--------+ CCA Prox  159     37                                         +----------+--------+--------+--------+------------------+--------+ CCA Mid   155     33                                         +----------+--------+--------+--------+------------------+--------+ CCA Distal129     41                                         +----------+--------+--------+--------+------------------+--------+ ICA Prox  73      24      1-39%                              +----------+--------+--------+--------+------------------+--------+ ICA Mid   90      25                                         +----------+--------+--------+--------+------------------+--------+ ICA Distal69      26                                          +----------+--------+--------+--------+------------------+--------+ ECA       254     27                                         +----------+--------+--------+--------+------------------+--------+ +----------+--------+-------+----------------+-------------------+  PSV cm/sEDV cmsDescribe        Arm Pressure (mmHG) +----------+--------+-------+----------------+-------------------+ [id number]     12     Multiphasic, [id number]                  +----------+--------+-------+----------------+-------------------+ +---------+--------+--+--------+--+---------+ VertebralPSV cm/s69EDV cm/s26Antegrade +---------+--------+--+--------+--+---------+  Left Carotid Findings: +----------+--------+--------+--------+------------------+--------+           PSV cm/sEDV cm/sStenosisPlaque DescriptionComments +----------+--------+--------+--------+------------------+--------+ CCA Prox  98      26                                         +----------+--------+--------+--------+------------------+--------+ CCA Mid   88      18                                         +----------+--------+--------+--------+------------------+--------+ CCA Distal103     27                                         +----------+--------+--------+--------+------------------+--------+ ICA Prox  108     37      1-39%                              +----------+--------+--------+--------+------------------+--------+ ICA Mid   102     30                                         +----------+--------+--------+--------+------------------+--------+ ICA Distal99      42                                         +----------+--------+--------+--------+------------------+--------+ ECA       193     30                                         +----------+--------+--------+--------+------------------+--------+ +----------+--------+--------+----------------+-------------------+           PSV cm/sEDV  cm/sDescribe        Arm Pressure (mmHG) +----------+--------+--------+----------------+-------------------+ [id number]     9       Multiphasic, [id number]                 +----------+--------+--------+----------------+-------------------+ +---------+--------+--+--------+--+---------+ VertebralPSV cm/s44EDV cm/s17Antegrade +---------+--------+--+--------+--+---------+   Summary: Right Carotid: Velocities in the right ICA are consistent with a 1-39% stenosis. Left Carotid: Velocities in the left ICA are consistent with a 1-39% stenosis. Vertebrals:  Bilateral vertebral arteries demonstrate antegrade flow. Subclavians: Normal flow hemodynamics were seen in bilateral subclavian              arteries. *See table(s) above for measurements and observations.  Electronically signed by Julien Nordmann MD on 01/12/2023 at 9:30:04 PM.    Final    Disposition   Pt is being discharged home today in good condition.  Follow-up Plans & Appointments     Discharge Instructions     Amb Referral to Cardiac Rehabilitation  Complete by: As directed    Diagnosis: Coronary Stents   After initial evaluation and assessments completed: Virtual Based Care may be provided alone or in conjunction with Phase 2 Cardiac Rehab based on patient barriers.: Yes   Intensive Cardiac Rehabilitation (ICR) [facility] location only OR Traditional Cardiac Rehabilitation (TCR) *If criteria for ICR are not met will enroll in TCR [facility] only): Yes   Diet - low sodium heart healthy   Complete by: As directed    Discharge instructions   Complete by: As directed    Groin Site Care Refer to this sheet in the next few weeks. These instructions provide you with information on caring for yourself after your procedure. Your caregiver may also give you more specific instructions. Your treatment has been planned according to current medical practices, but problems sometimes occur. Call your caregiver if you have any problems or questions after  your procedure. HOME CARE INSTRUCTIONS You may shower 24 hours after the procedure. Remove the bandage (dressing) and gently wash the site with plain soap and water. Gently pat the site dry.  Do not apply powder or lotion to the site.  Do not sit in a bathtub, swimming pool, or whirlpool for 5 to 7 days.  No bending, squatting, or lifting anything over 10 pounds (4.5 kg) as directed by your caregiver.  Inspect the site at least twice daily.  Do not drive home if you are discharged the same day of the procedure. Have someone else drive you.  You may drive 24 hours after the procedure unless otherwise instructed by your caregiver.  What to expect: Any bruising will usually fade within 1 to 2 weeks.  Blood that collects in the tissue (hematoma) may be painful to the touch. It should usually decrease in size and tenderness within 1 to 2 weeks.  SEEK IMMEDIATE MEDICAL CARE IF: You have unusual pain at the groin site or down the affected leg.  You have redness, warmth, swelling, or pain at the groin site.  You have drainage (other than a small amount of blood on the dressing).  You have chills.  You have a fever or persistent symptoms for more than 72 hours.  You have a fever and your symptoms suddenly get worse.  Your leg becomes pale, cool, tingly, or numb.  You have heavy bleeding from the site. Hold pressure on the site. Marland Kitchen  PLEASE DO NOT Caldwell ANY DOSES OF YOUR PLAVIX!!!!! Also keep a log of you blood pressures and bring back to your follow up appt. Please call the office with any questions.   Patients taking blood thinners should generally stay away from medicines like ibuprofen, Advil, Motrin, naproxen, and Aleve due to risk of stomach bleeding. You may take Tylenol as directed or talk to your primary doctor about alternatives.   PLEASE ENSURE THAT YOU DO NOT RUN OUT OF YOUR PLAVIX. This medication is very important to remain on for at least 6months. IF you have issues obtaining this  medication due to cost please CALL the office 3-5 business days prior to running out in order to prevent missing doses of this medication.   Increase activity slowly   Complete by: As directed         Discharge Medications   Allergies as of 01/29/2023   No Known Allergies      Medication List     TAKE these medications    amLODipine 5 MG tablet Commonly known as: NORVASC Take 5 mg by mouth daily.  aspirin EC 81 MG tablet Take 1 tablet (81 mg total) by mouth daily for 7 days. Swallow whole.   atorvastatin 40 MG tablet Commonly known as: LIPITOR Take 1 tablet (40 mg total) by mouth daily. What changed:  medication strength how much to take   cholecalciferol 1000 units tablet Commonly known as: VITAMIN D Take 1,000 Units by mouth daily.   clopidogrel 75 MG tablet Commonly known as: PLAVIX Take 1 tablet (75 mg total) by mouth daily with breakfast.   Eliquis 5 MG Tabs tablet Generic drug: apixaban Take 1 tablet (5 mg total) by mouth 2 (two) times daily.   nitroGLYCERIN 0.4 MG SL tablet Commonly known as: Nitrostat Place 1 tablet (0.4 mg total) under the tongue every 5 (five) minutes as needed for chest pain.   valsartan-hydrochlorothiazide 160-12.5 MG tablet Commonly known as: DIOVAN-HCT Take 1 tablet by mouth daily.        Outstanding Labs/Studies   N/a   Duration of Discharge Encounter   Greater than 30 minutes including physician time.  Signed, Laverda Page, NP 01/29/2023, 9:38 AM  Patient seen, examined. Available data reviewed. Agree with findings, assessment, and plan as outlined by Laverda Page, NP.  The patient is independently interviewed and examined.  HEENT is normal, JVP is normal, lungs are clear bilaterally, heart is regular rate and rhythm no murmur gallop, abdomen soft nontender, right femoral access site is clear with no hematoma or ecchymosis, lower extremities have no edema.  Cardiac catheterization films are reviewed.  The  patient has moderately severe proximal RCA stenosis which was evaluated by pressure wire and found to be negative for ischemia.  He underwent successful PCI of the mid LAD treating severe stenosis with a Synergy stent.  He had some minimal chest discomfort this morning but walked the hall with no symptoms.  He will continue apixaban as planned through January for treatment of DVT.  He will remain on clopidogrel and take aspirin for 1 week.  Once he stops apixaban early next year, he should be restarted on low-dose aspirin so that he completes 6 months of DAPT.  Patient medically stable for discharge today.  We discussed secondary risk reduction at length with diet and lifestyle modification and intensive lipid-lowering with goal LDL less than 55.  Tonny Bollman, M.D. 01/29/2023 10:34 AM

## 2023-01-30 LAB — LIPOPROTEIN A (LPA): Lipoprotein (a): 67.7 nmol/L — ABNORMAL HIGH (ref <75.0)

## 2023-02-04 ENCOUNTER — Telehealth (HOSPITAL_COMMUNITY): Payer: Self-pay

## 2023-02-04 NOTE — Telephone Encounter (Signed)
Called patient to see if he is interested in the Cardiac Rehab Program. Patient expressed interest. Explained scheduling process and went over insurance, patient verbalized understanding. Will contact patient for scheduling once f/u has been completed.  °

## 2023-02-04 NOTE — Telephone Encounter (Signed)
Pt insurance is active and benefits verified through Medicare A/B. Co-pay $0.00, DED $240.00/$240.00 met, out of pocket $0.00/$0.00 met, co-insurance 20%. No pre-authorization required. [employer]port, 02/04/23 @ 12:08PM, REF#[id number]   How many CR sessions are covered? (36 visits for TCR, 72 visits for ICR)72 Is this a lifetime maximum or an annual maximum? Lifetime Has the member used any of these services to date? No Is there a time limit (weeks/months) on start of program and/or program completion? No  2ndary insurance is active and benefits verified through [employer]. Co-pay $0.00, DED $0.00/$0.00 met, out of pocket $0.00/$0.00 met, co-insurance 0%. No pre-authorization required. [employer]port, 02/04/23 @ 12:11PM, REF#[id number]    Will contact patient to see if he is interested in the Cardiac Rehab Program. If interested, patient will need to complete follow up appt. Once completed, patient will be contacted for scheduling upon review by the RN Navigator.

## 2023-02-08 ENCOUNTER — Other Ambulatory Visit: Payer: Self-pay

## 2023-02-08 ENCOUNTER — Encounter: Payer: Self-pay | Admitting: Nurse Practitioner

## 2023-02-08 ENCOUNTER — Ambulatory Visit: Payer: Medicare Other | Attending: Nurse Practitioner | Admitting: Nurse Practitioner

## 2023-02-08 VITALS — BP 112/70 | HR 72 | Ht 70.0 in | Wt 245.0 lb

## 2023-02-08 DIAGNOSIS — I2510 Atherosclerotic heart disease of native coronary artery without angina pectoris: Secondary | ICD-10-CM | POA: Diagnosis not present

## 2023-02-08 DIAGNOSIS — Z86718 Personal history of other venous thrombosis and embolism: Secondary | ICD-10-CM | POA: Insufficient documentation

## 2023-02-08 DIAGNOSIS — E785 Hyperlipidemia, unspecified: Secondary | ICD-10-CM

## 2023-02-08 DIAGNOSIS — I77810 Thoracic aortic ectasia: Secondary | ICD-10-CM | POA: Insufficient documentation

## 2023-02-08 DIAGNOSIS — I6523 Occlusion and stenosis of bilateral carotid arteries: Secondary | ICD-10-CM | POA: Insufficient documentation

## 2023-02-08 DIAGNOSIS — I493 Ventricular premature depolarization: Secondary | ICD-10-CM | POA: Insufficient documentation

## 2023-02-08 DIAGNOSIS — I10 Essential (primary) hypertension: Secondary | ICD-10-CM | POA: Insufficient documentation

## 2023-02-08 DIAGNOSIS — I447 Left bundle-branch block, unspecified: Secondary | ICD-10-CM | POA: Diagnosis not present

## 2023-02-08 NOTE — Progress Notes (Signed)
Office Visit    Patient Name: Kevin Caldwell. Date of Encounter: 02/08/2023  Primary Care Provider:  Emilio Aspen, MD Primary Cardiologist:  Kristeen Miss, MD  Chief Complaint    77 year old male with a history of CAD s/p DES-mLAD, LBBB, DVT, hypertension, hyperlipidemia who presents for hospital follow-up related to CAD s/p DES.  Past Medical History    Past Medical History:  Diagnosis Date   DVT (deep venous thrombosis) (HCC)    Hypertension    Renal disorder    Past Surgical History:  Procedure Laterality Date   CHOLECYSTECTOMY     CORONARY PRESSURE/FFR STUDY N/A 01/28/2023   Procedure: CORONARY PRESSURE/FFR STUDY;  Surgeon: Yvonne Kendall, MD;  Location: [facility];  Service: Cardiovascular;  Laterality: N/A;   CORONARY STENT INTERVENTION N/A 01/28/2023   Procedure: CORONARY STENT INTERVENTION;  Surgeon: Yvonne Kendall, MD;  Location: [facility];  Service: Cardiovascular;  Laterality: N/A;   HERNIA REPAIR     LEFT HEART CATH AND CORONARY ANGIOGRAPHY N/A 01/21/2023   Procedure: LEFT HEART CATH AND CORONARY ANGIOGRAPHY;  Surgeon: Yvonne Kendall, MD;  Location: [facility];  Service: Cardiovascular;  Laterality: N/A;    Allergies  No Known Allergies   Labs/Other Studies Reviewed    The following studies were reviewed today:  Cardiac Studies & Procedures   CARDIAC CATHETERIZATION  CARDIAC CATHETERIZATION 01/28/2023  Narrative Conclusions: Two-vessel coronary artery disease, similar to last week's diagnostic angiogram.  70 to 80% proximal RCA and 60% mid RCA lesions are not hemodynamically significant (RFR = 0.90). Successful PCI to 80-90% mid LAD stenosis using Synergy 2.75 x 12 mm drug-eluting stent with 0% residual stenosis and TIMI-3 flow.  Plaque shift into the ostium of D2 occurred with 70% stenosis but preservation of TIMI-3 flow.  Intervention was not attempted on D2.  Recommendations: Remove right femoral artery  sheath with manual compression when ACT has fallen below 175 seconds. Overnight observation. If no evidence of bleeding or vascular injury, anticipate resumption of apixaban 5 mg twice daily tomorrow.  Recommend continuation of aspirin, clopidogrel, and apixaban for 1 week, after which time aspirin can be discontinued and clopidogrel and apixaban maintained.  If apixaban is discontinued in January (as anticipated), aspirin 81 mg daily should be resumed with intention of completing at least 6 months of dual antiplatelet therapy. Aggressive secondary prevention of coronary artery disease.  I will increase atorvastatin to 40 mg daily and check a lipid panel with tomorrow morning's labs.  Yvonne Kendall, MD [facility]  Findings Coronary Findings Diagnostic  Dominance: Right  Left Main Vessel is large. Vessel is angiographically normal.  Left Anterior Descending Vessel is large. The vessel is severely calcified. Prox LAD lesion is 50% stenosed. The lesion is eccentric. The lesion is calcified. Mid LAD lesion is 85% stenosed. The lesion is calcified.  First Diagonal Branch Vessel is moderate in size.  Second Diagonal Branch Vessel is moderate in size.  Left Circumflex Vessel is large.  First Obtuse Marginal Branch Vessel is moderate in size.  Second Obtuse Marginal Branch Vessel is moderate in size. 2nd Mrg lesion is 40% stenosed.  Third Obtuse Marginal Branch Vessel is moderate in size.  Right Coronary Artery Vessel is large. Prox RCA lesion is 75% stenosed. Pressure gradient was performed on the lesion using a GUIDEWIRE PRESSURE X 175 and CATH LAUNCHER 6FR JR4. RFR: 0.9. Mid RCA lesion is 60% stenosed. Pressure gradient was performed on the lesion using a GUIDEWIRE  PRESSURE X 175 and CATH LAUNCHER 49FR JR4. RFR: 0.9. Dist RCA lesion is 50% stenosed. The lesion is eccentric.  Right Posterior Descending Artery Vessel is large in size.  Right Posterior Atrioventricular  Artery Vessel is moderate in size.  First Right Posterolateral Branch Vessel is moderate in size.  Intervention  Mid LAD lesion Stent Lesion length:  10 mm. CATH VISTA GUIDE 49FR XBLAD3.5 guide catheter was inserted. Lesion crossed with guidewire using a WIRE RUNTHROUGH .[id number]. Pre-stent angioplasty was performed using a BALLN EMERGE MR 2.25X8. Maximum pressure:  8 atm. A drug-eluting stent was successfully placed using a SYNERGY XD 2.75X12. Maximum pressure: 14 atm. Stent strut is well apposed. Post-stent angioplasty was performed using a BALLN [employer] EMERGE MR 2.75X8. Maximum pressure:  16 atm. Post-Intervention Lesion Assessment The intervention was successful. Pre-interventional TIMI flow is 3. Post-intervention TIMI flow is 3. No complications occurred at this lesion. There is a 0% residual stenosis post intervention.   CARDIAC CATHETERIZATION  CARDIAC CATHETERIZATION 01/21/2023  Narrative Conclusions: Significant two-vessel coronary artery disease including heavily calcified LAD with sequential 50% proximal and 80-90% mid vessel stenoses and 70-80% proximal RCA lesion followed by 60% mid and 50% distal stenoses.  OM2 with 40% lesion as well. Normal left ventricular systolic function (LVEF 55-65%) with upper normal filling pressure (LVEDP 15 mmHg). High takeoff of the right radial artery as well as tortuous right subclavian artery limiting diagnostic catheter manipulation and preventing advancement of a 49F guide catheter into the ascending aorta.  Recommend alternative access for future catheterizations.  Recommendations: Plan for staged PCI to the LAD using alternative access.  Procedure tentatively scheduled for 01/28/2023. If no evidence of bleeding or vascular complication at right radial arteriotomy site, resume apixaban tomorrow.  Apixaban will need to be held for 2 days prior to next week's PCI. Aggressive secondary prevention of coronary artery disease.  Will add aspirin 81  mg daily pending PCI and load with clopidogrel when the patient arrives for catheterization next week.  Anticipate triple therapy with aspirin, clopidogrel, and apixaban for 1 week after intervention followed by at least 6 months of apixaban and clopidogrel (alternatively aspirin and clopidogrel if apixaban can be discontinued sooner). Repeat BMP at the end of this week to ensure stable renal function.  Findings and recommendations were discussed with Mr. Rhatigan, his wife, and Dr. Elease Hashimoto.  Yvonne Kendall, MD [facility]  Findings Coronary Findings Diagnostic  Dominance: Right  Left Main Vessel is large. Vessel is angiographically normal.  Left Anterior Descending Vessel is large. The vessel is severely calcified. Prox LAD lesion is 50% stenosed. The lesion is eccentric. The lesion is calcified. Mid LAD lesion is 85% stenosed. The lesion is calcified.  First Diagonal Branch Vessel is moderate in size.  Second Diagonal Branch Vessel is moderate in size.  Left Circumflex Vessel is large.  First Obtuse Marginal Branch Vessel is moderate in size.  Second Obtuse Marginal Branch Vessel is moderate in size. 2nd Mrg lesion is 40% stenosed.  Third Obtuse Marginal Branch Vessel is moderate in size.  Right Coronary Artery Vessel is large. Prox RCA lesion is 75% stenosed. Mid RCA lesion is 60% stenosed. Dist RCA lesion is 50% stenosed. The lesion is eccentric.  Right Posterior Descending Artery Vessel is large in size.  Right Posterior Atrioventricular Artery Vessel is moderate in size.  First Right Posterolateral Branch Vessel is moderate in size.  Intervention  No interventions have been documented.   STRESS TESTS  NM PET CT CARDIAC PERFUSION  MULTI W/ABSOLUTE BLOODFLOW 01/16/2023  Narrative   Findings are consistent with apical infarction with peri-infarct ischemia. The study is overall low-intermediate risk due to normal MBFR, no TID, and no significant  ischemia with overall small distal territory of infarct.   LV perfusion is abnormal. There is evidence of ischemia. There is evidence of infarction. Defect 1: There is a medium defect with moderate reduction in uptake present in the apical inferior, septal and apex location(s) that is partially reversible. There is abnormal wall motion in the defect area. Consistent with infarction and peri-infarct ischemia.   Rest left ventricular function is normal. Rest EF: 60%. Stress left ventricular function is normal. Stress EF: 64%. End diastolic cavity size is mildly enlarged. End systolic cavity size is normal. No evidence of transient ischemic dilation (TID) noted.   Myocardial blood flow was computed to be 0.55ml/g/min at rest and 1.36ml/g/min at stress. Global myocardial blood flow reserve was 2.24 and was normal.   Coronary calcium was present on the attenuation correction CT images. Severe coronary calcifications were present. Coronary calcifications were present in the left anterior descending artery, left circumflex artery and right coronary artery distribution(s).  CLINICAL DATA:  This over-read does not include interpretation of cardiac or coronary anatomy or pathology. The Cardiac PET CT interpretation by the cardiologist is attached.  COMPARISON:  CT chest coronary calcium scoring, 12/12/2022  FINDINGS: Cardiovascular: No significant vascular findings. Mild cardiomegaly. Left and right coronary artery calcifications and or stents. No pericardial effusion.  Limited Mediastinum/Nodes: No enlarged mediastinal, hilar, or axillary lymph nodes. Trachea and esophagus demonstrate no significant findings.  Limited Lungs/Pleura: Unchanged, definitively benign nodule of the central right middle lobe, requiring no further follow-up or characterization (series 4, image 40). No pleural effusion or pneumothorax.  Upper Abdomen: No acute abnormality.  Hepatic steatosis.  Musculoskeletal: No chest  wall abnormality. No acute osseous findings.  IMPRESSION: 1. No acute CT findings of the chest. 2. Coronary artery disease. 3. Hepatic steatosis.   Electronically Signed By: Jearld Lesch M.D. On: 01/16/2023 12:16      CT SCANS  CT CARDIAC SCORING (SELF PAY ONLY) 12/12/2022  Addendum 01/05/2023  1:02 PM ADDENDUM REPORT: 01/05/2023 13:00  EXAM: OVER-READ INTERPRETATION  CT CHEST  The following report is an over-read performed by radiologist Dr. Narda Rutherford of [facility] on 01/05/2023. This over-read does not include interpretation of cardiac or coronary anatomy or pathology. The coronary calcium score interpretation by the cardiologist is attached.  COMPARISON:  Chest CT 09/25/2005  FINDINGS: Vascular: Aortic atherosclerosis.  Mediastinum/nodes: No adenopathy or mass.  Small hiatal hernia.  Lungs: No focal airspace disease. 11 mm smoothly marginated nodule in the central right middle lobe series 5, image 15 is essentially unchanged from remote 2007 exam and can be considered benign. No suspicious pulmonary nodule. No pleural fluid. The included airways are patent.  Upper abdomen: No acute or unexpected findings.  Cholecystectomy.  Musculoskeletal: There are no acute or suspicious osseous abnormalities. Thoracic spondylosis with anterior spurring.  IMPRESSION: 1. No acute or unexpected extracardiac findings. 2. Small hiatal hernia.  Aortic Atherosclerosis (ICD10-I70.0).   Electronically Signed By: Narda Rutherford M.D. On: 01/05/2023 13:00  Narrative CLINICAL DATA:  Cardiovascular Disease Risk stratification  EXAM: Coronary Calcium Score  TECHNIQUE: A gated, non-contrast computed tomography scan of the heart was performed using 3mm slice thickness. Axial images were analyzed on a dedicated workstation. Calcium scoring of the coronary arteries was performed using the Agatston method.  FINDINGS: Coronary arteries:  Normal  origins.  Coronary Calcium Score:  Left main: 47  Left anterior descending artery: 1030  Left circumflex artery: 15  Right coronary artery: 503  Total: 1595  Percentile: 83  Pericardium: Normal.  Ascending Aorta: Dilated ascending aorta measuring 40mm  Non-cardiac: See separate report from [facility].  IMPRESSION: 1. Coronary calcium score of 1595. This was 83rd percentile for age-, race-, and sex-matched controls.  2.  Dilated ascending aorta measuring 40mm  RECOMMENDATIONS: Coronary artery calcium (CAC) score is a strong predictor of incident coronary heart disease (CHD) and provides predictive information beyond traditional risk factors. CAC scoring is reasonable to use in the decision to withhold, postpone, or initiate statin therapy in intermediate-risk or selected borderline-risk asymptomatic adults (age 72-75 years and LDL-C >=70 to <190 mg/dL) who do not have diabetes or established atherosclerotic cardiovascular disease (ASCVD).* In intermediate-risk (10-year ASCVD risk >=7.5% to <20%) adults or selected borderline-risk (10-year ASCVD risk >=5% to <7.5%) adults in whom a CAC score is measured for the purpose of making a treatment decision the following recommendations have been made:  If CAC=0, it is reasonable to withhold statin therapy and reassess in 5 to 10 years, as long as higher risk conditions are absent (diabetes mellitus, family history of premature CHD in first degree relatives (males <55 years; females <65 years), cigarette smoking, or LDL >=190 mg/dL).  If CAC is 1 to 99, it is reasonable to initiate statin therapy for patients >=74 years of age.  If CAC is >=100 or >=75th percentile, it is reasonable to initiate statin therapy at any age.  Cardiology referral should be considered for patients with CAC scores >=400 or >=75th percentile.  *2018 AHA/ACC/AACVPR/AAPA/ABC/ACPM/ADA/AGS/APhA/ASPC/NLA/PCNA Guideline on the Management  of Blood Cholesterol: A Report of the American College of Cardiology/American Heart Association Task Force on Clinical Practice Guidelines. J Am Coll Cardiol. 2019;73(24):3168-3209.  Epifanio Lesches, MD  Electronically Signed: By: Epifanio Lesches M.D. On: 12/13/2022 23:39         Recent Labs: 01/29/2023: BUN 26; Creatinine, Ser 1.25; Hemoglobin 12.1; Platelets 158; Potassium 4.6; Sodium 139  Recent Lipid Panel    Component Value Date/Time   CHOL 200 07/21/2008 1054   TRIG 99.0 07/21/2008 1054   HDL 37.90 (L) 07/21/2008 1054   CHOLHDL 5 07/21/2008 1054   VLDL 19.8 07/21/2008 1054   LDLCALC 142 (H) 07/21/2008 1054    History of Present Illness    77 year old male with the above past medical history including CAD s/p DES-mLAD, LBBB, DVT, hypertension, and hyperlipidemia.  He has a history of DVT on Eliquis.  CT calcium score in 12/2022 was 1535 (83 percentile).  Cardiac PET stress test on 01/16/2023 was consistent with apical infarction with peri-infarct ischemia, EF 60%.  He was last seen in office on 01/18/2023 and was referred for outpatient cardiac catheterization.  He underwent cardiac catheterization on 01/28/2023 which revealed two-vessel CAD with 70 to 80% proximal RCA stenosis, 60% mid RCA stenosis, not hemodynamically significant, 80 to 90% mid LAD stenosis s/p DES x 1, there was evidence of plaque shift into the ostium of the left to that occurred with 70% stenosis with TIMI 3 flow, therefore, intervention was not completed.  He was started on aspirin and Plavix.  He was advised to continue triple therapy with aspirin, Plavix, Eliquis for 1 week followed by discontinuation of aspirin at that point.  If Eliquis discontinued in 03/2023 as planned, he would then resume aspirin.  His atorvastatin was increased to 40 mg daily.  He was discharged home in stable condition on 01/29/2023.  He presents today for follow-up accompanied by his son.  Since his procedure he has   been stable from a cardiac standpoint.  He reports 1 episode of mild chest discomfort that lasted less than a minute.  His symptoms occurred at rest following a meal.  He also had an episode of mild lightheadedness after he took his morning medications without eating. He denies any palpitations, presyncope, syncope, denies any exertional symptoms concerning for angina.  He does note some mild bilateral shoulder discomfort, he questions whether or not this is related to increased statin dose.  He is also concerned that there is a comment in his cath report that references a balloon rupture during his stent procedure.  He has asked that I reach out to the interventional cardiologist to ensure that there is no remaining material inside his artery. He states he is interested in cardiac rehab.    Home Medications    Current Outpatient Medications  Medication Sig Dispense Refill   amLODipine (NORVASC) 5 MG tablet Take 5 mg by mouth daily.     apixaban (ELIQUIS) 5 MG TABS tablet Take 1 tablet (5 mg total) by mouth 2 (two) times daily. 180 tablet 1   atorvastatin (LIPITOR) 40 MG tablet Take 1 tablet (40 mg total) by mouth daily. 90 tablet 1   cholecalciferol (VITAMIN D) 1000 units tablet Take 1,000 Units by mouth daily.     clopidogrel (PLAVIX) 75 MG tablet Take 1 tablet (75 mg total) by mouth daily with breakfast. 90 tablet 1   nitroGLYCERIN (NITROSTAT) 0.4 MG SL tablet Place 1 tablet (0.4 mg total) under the tongue every 5 (five) minutes as needed for chest pain. 25 tablet PRN   valsartan-hydrochlorothiazide (DIOVAN-HCT) 160-12.5 MG tablet Take 1 tablet by mouth daily.     No current facility-administered medications for this visit.     Review of Systems    He denies chest pain, palpitations, dyspnea, pnd, orthopnea, n, v, dizziness, syncope, edema, weight gain, or early satiety. All other systems reviewed and are otherwise negative except as noted above.     Cardiac Rehabilitation Eligibility  Assessment  The patient is ready to start cardiac rehabilitation from a cardiac standpoint.    Physical Exam    VS:  BP 112/70 (BP Location: Left Arm, Patient Position: Sitting, Cuff Size: Normal)   Pulse 72   Ht 5\' 10"  (1.778 m)   Wt 245 lb (111.1 kg)   SpO2 96%   BMI 35.15 kg/m   GEN: Well nourished, well developed, in no acute distress. HEENT: normal. Neck: Supple, no JVD, carotid bruits, or masses. Cardiac: RRR, no murmurs, rubs, or gallops. No clubbing, cyanosis, edema.  Radials/DP/PT 2+ and equal bilaterally.  Right radial cath site without bruising, bleeding, bruit, or hematoma.  Right femoral cath site with minimal bruising, no bleeding or hematoma. Respiratory:  Respirations regular and unlabored, clear to auscultation bilaterally. GI: Soft, nontender, nondistended, BS + x 4. MS: no deformity or atrophy. Skin: warm and dry, no rash. Neuro:  Strength and sensation are intact. Psych: Normal affect.  Accessory Clinical Findings    ECG personally reviewed by me today - EKG Interpretation Date/Time:  Friday February 08 2023 08:43:24 EST Ventricular Rate:  72 PR Interval:  200 QRS Duration:  150 QT Interval:  428 QTC Calculation: 468 R Axis:   32  Text Interpretation: Sinus rhythm with Premature atrial complexes Left bundle branch block When compared with  ECG of 29-Jan-2023 05:30, No significant change was found Confirmed by Bernadene Person ([id number]) on 02/08/2023 8:48:46 AM  - no acute changes.   Lab Results  Component Value Date   WBC 6.9 01/29/2023   HGB 12.1 (L) 01/29/2023   HCT 36.0 (L) 01/29/2023   MCV 98.6 01/29/2023   PLT 158 01/29/2023   Lab Results  Component Value Date   CREATININE 1.25 (H) 01/29/2023   BUN 26 (H) 01/29/2023   NA 139 01/29/2023   K 4.6 01/29/2023   CL 108 01/29/2023   CO2 23 01/29/2023   Lab Results  Component Value Date   ALT 19 06/22/2015   AST 23 06/22/2015   ALKPHOS 42 06/22/2015   BILITOT 1.1 06/22/2015   Lab Results   Component Value Date   CHOL 200 07/21/2008   HDL 37.90 (L) 07/21/2008   LDLCALC 142 (H) 07/21/2008   TRIG 99.0 07/21/2008   CHOLHDL 5 07/21/2008    No results found for: "HGBA1C"  Assessment & Plan   1. CAD/LBBB: S/p DES-mLAD, there was evidence of plaque shift into the ostium of the left to that occurred with 70% stenosis with TIMI 3 flow, therefore, intervention was not completed. He had residual 70 to 80% proximal RCA stenosis, 60% mid RCA stenosis, not hemodynamically significant. He was started on aspirin and Plavix.  He was advised to continue triple therapy with aspirin, Plavix, Eliquis for 1 week followed by discontinuation of aspirin at that point (he will resume aspirin upon discontinuation of Eliquis).  Since his procedure he has had 1 episode of mild chest discomfort that lasted less than a minute, his symptoms occurred at rest following a meal.  Denies any exertional symptoms concerning for angina.  He expressed concern today that his cath report mentioned balloon rupture during his stent procedure.  He has asked that I reach out to the interventional cardiologist to ensure that there is no potential complication associated with this (no remaining material inside).  I reassured him that this is not likely.   I will reach out to Dr. Okey Dupre.  Encouraged increase activity as tolerated.  Reviewed ED precautions, nitroglycerin use.  He is interested in cardiac rehab.  Continue Plavix as he is no ASA in the setting of of Eliquis), metoprolol, Crestor, and Zetia.  2. History of DVT: On Eliquis, he will discuss with his PCP in January 2025 the possibility of discontinuing Eliquis at that time.  Recommend resumption of aspirin 81 mg daily in addition to continuation of Plavix therapy once Eliquis is discontinued.  3. Hypertension: BP well controlled. Continue current antihypertensive regimen.   4. Hyperlipidemia: LDL was 69 in 10/2022.  Atorvastatin was recently increased to 40 mg daily.  Plan for  repeat fasting lipids, LFTs in 6 to 8 weeks.  5. Dilation of ascending aorta: Cardiac CT scoring in 12/2022 showed mild dilation of the ascending aorta measuring 40 mm.  Plan for repeat CT chest/aorta in 1 year for continued monitoring.  6. PVCs: Noted on prior EKGs.  He denies any palpitations.  Not appreciated on today's EKG.  Continue to monitor for symptoms.  7. Carotid artery stenosis: Carotid ultrasound in 01/2023 showed 1 to 39% B ICA stenosis.  He is asymptomatic.  Continue Lipitor.  6. Disposition: Follow-up in 3 months with Dr. Elease Hashimoto or [address] APP.       Joylene Grapes, NP 02/08/2023, 12:43 PM

## 2023-02-08 NOTE — Patient Instructions (Signed)
Medication Instructions:  Your physician recommends that you continue on your current medications as directed. Please refer to the Current Medication list given to you today.  *If you need a refill on your cardiac medications before your next appointment, please call your pharmacy*   Lab Work: Your physician recommends that you return for lab work in 6-8 weeks. Fasting lipid panel & LFTs   Testing/Procedures: NONE ordered at this time of appointment     Follow-Up: At [facility] you and your health needs are our priority.  As part of our continuing mission to provide you with exceptional heart care, we have created designated Provider Care Teams.  These Care Teams include your primary Cardiologist (physician) and Advanced Practice Providers (APPs -  Physician Assistants and Nurse Practitioners) who all work together to provide you with the care you need, when you need it.  We recommend signing up for the patient portal called "MyChart".  Sign up information is provided on this After Visit Summary.  MyChart is used to connect with patients for Virtual Visits (Telemedicine).  Patients are able to view lab/test results, encounter notes, upcoming appointments, etc.  Non-urgent messages can be sent to your provider as well.   To learn more about what you can do with MyChart, go to [email or web address].    Your next appointment:   3 month(s)  Provider:   Kristeen Miss, MD or Any APP [facility]    Other Instructions

## 2023-02-12 ENCOUNTER — Encounter (HOSPITAL_COMMUNITY): Payer: Self-pay

## 2023-02-14 ENCOUNTER — Encounter (HOSPITAL_COMMUNITY)
Admission: RE | Admit: 2023-02-14 | Discharge: 2023-02-14 | Disposition: A | Discharge status: Home / Self Care | Payer: Medicare Other | Source: Ambulatory Visit | Attending: Cardiovascular Disease | Admitting: Cardiovascular Disease

## 2023-02-14 VITALS — BP 103/63 | HR 66 | Ht 70.5 in | Wt 243.6 lb

## 2023-02-14 DIAGNOSIS — Z955 Presence of coronary angioplasty implant and graft: Secondary | ICD-10-CM | POA: Insufficient documentation

## 2023-02-14 NOTE — Progress Notes (Signed)
Cardiac Rehab Medication Review   Does the patient  feel that his/her medications are working for him/her?  yes  Has the patient been experiencing any side effects to the medications prescribed?  no  Does the patient measure his/her own blood pressure or blood glucose at home?  no   Does the patient have any problems obtaining medications due to transportation or finances?   no  Understanding of regimen: excellent Understanding of indications: excellent Potential of compliance: excellent    Comments: Pt has no questions about his medications.    Lorin Picket 02/14/2023 1:18 PM

## 2023-02-14 NOTE — Progress Notes (Deleted)
Cardiac Rehab Medication Review   Does the patient  feel that his/her medications are working for him/her?   Yes  Has the patient been experiencing any side effects to the medications prescribed?  No  Does the patient measure his/her own blood pressure or blood glucose at home?  No  Does the patient have any problems obtaining medications due to transportation or finances?   no  Understanding of regimen: excellent Understanding of indications: excellent Potential of compliance: excellent    Comments: No question about any of his medications.     Lorin Picket 02/14/2023 11:05 AM

## 2023-02-14 NOTE — Progress Notes (Signed)
Cardiac Individual Treatment Plan  Patient Details  Name: Richy Didier. MRN: [id number] Date of Birth: January 01, 1946 Referring Provider:   Flowsheet Row INTENSIVE CARDIAC REHAB ORIENT from 02/14/2023 in [facility]  Referring Provider Kristeen Miss, MD       Initial Encounter Date:  Flowsheet Row INTENSIVE CARDIAC REHAB ORIENT from 02/14/2023 in [facility]  Date 02/14/23       Visit Diagnosis: 01/28/23 S/P coronary artery stent placement  Patient's Home Medications on Admission:  Current Outpatient Medications:    amLODipine (NORVASC) 5 MG tablet, Take 5 mg by mouth daily., Disp: , Rfl:    apixaban (ELIQUIS) 5 MG TABS tablet, Take 1 tablet (5 mg total) by mouth 2 (two) times daily., Disp: 180 tablet, Rfl: 1   atorvastatin (LIPITOR) 40 MG tablet, Take 1 tablet (40 mg total) by mouth daily., Disp: 90 tablet, Rfl: 1   cholecalciferol (VITAMIN D) 1000 units tablet, Take 1,000 Units by mouth daily., Disp: , Rfl:    clopidogrel (PLAVIX) 75 MG tablet, Take 1 tablet (75 mg total) by mouth daily with breakfast., Disp: 90 tablet, Rfl: 1   nitroGLYCERIN (NITROSTAT) 0.4 MG SL tablet, Place 1 tablet (0.4 mg total) under the tongue every 5 (five) minutes as needed for chest pain., Disp: 25 tablet, Rfl: PRN   valsartan-hydrochlorothiazide (DIOVAN-HCT) 160-12.5 MG tablet, Take 1 tablet by mouth daily., Disp: , Rfl:   Past Medical History: Past Medical History:  Diagnosis Date   DVT (deep venous thrombosis) (HCC)    Hypertension    Renal disorder     Tobacco Use: Social History   Tobacco Use  Smoking Status Never  Smokeless Tobacco Never    Labs: Review Flowsheet       Latest Ref Rng & Units 07/21/2008  Labs for ITP Cardiac and Pulmonary Rehab  Cholestrol 0 - 200 mg/dL 621   LDL (calc) 0 - 99 mg/dL 308   HDL-C >65.78 mg/dL 46.96   Trlycerides 0.0 - 149.0 mg/dL  29.5     Capillary Blood Glucose: No results found for: "GLUCAP"   Exercise Target Goals: Exercise Program Goal: Individual exercise prescription set using results from initial 6 min walk test and THRR while considering  patient's activity barriers and safety.   Exercise Prescription Goal: Initial exercise prescription builds to 30-45 minutes a day of aerobic activity, 2-3 days per week.  Home exercise guidelines will be given to patient during program as part of exercise prescription that the participant will acknowledge.  Activity Barriers & Risk Stratification:  Activity Barriers & Cardiac Risk Stratification - 02/14/23 1257       Activity Barriers & Cardiac Risk Stratification   Activity Barriers Balance Concerns;Deconditioning;Neck/Spine Problems    Cardiac Risk Stratification High             6 Minute Walk:  6 Minute Walk     Row Name 02/14/23 1229         6 Minute Walk   Phase Initial     Distance 1440 feet     Walk Time 6 minutes     # of Rest Breaks 0     MPH 2.73     METS 2.3     RPE 9     Perceived Dyspnea  0     VO2 Peak 2.3     Symptoms No     Resting HR 69  bpm     Resting BP 103/63     Resting Oxygen Saturation  99 %     Exercise Oxygen Saturation  during 6 min walk 98 %     Max Ex. HR 100 bpm     Max Ex. BP 119/72     2 Minute Post BP 104/70              Oxygen Initial Assessment:   Oxygen Re-Evaluation:   Oxygen Discharge (Final Oxygen Re-Evaluation):   Initial Exercise Prescription:  Initial Exercise Prescription - 02/14/23 1300       Date of Initial Exercise RX and Referring Provider   Date 02/14/23    Referring Provider Kristeen Miss, MD    Expected Discharge Date 05/08/23      Bike   Level 2    Watts 25    Minutes 15    METs 2.3      Recumbant Elliptical   Level 2    RPM 50    Watts 25    Minutes 15    METs 2.3      Prescription Details   Frequency (times per week) 3    Duration Progress to 30 minutes of  continuous aerobic without signs/symptoms of physical distress      Intensity   THRR 40-80% of Max Heartrate 57-114    Ratings of Perceived Exertion 11-13    Perceived Dyspnea 0-4      Progression   Progression Continue progressive overload as per policy without signs/symptoms or physical distress.      Resistance Training   Training Prescription Yes    Weight 3 lbs    Reps 10-15             Perform Capillary Blood Glucose checks as needed.  Exercise Prescription Changes:   Exercise Comments:   Exercise Goals and Review:   Exercise Goals     Row Name 02/14/23 1104             Exercise Goals   Increase Physical Activity Yes       Intervention Provide advice, education, support and counseling about physical activity/exercise needs.;Develop an individualized exercise prescription for aerobic and resistive training based on initial evaluation findings, risk stratification, comorbidities and participant's personal goals.       Expected Outcomes Long Term: Exercising regularly at least 3-5 days a week.;Short Term: Attend rehab on a regular basis to increase amount of physical activity.;Long Term: Add in home exercise to make exercise part of routine and to increase amount of physical activity.       Increase Strength and Stamina Yes       Intervention Provide advice, education, support and counseling about physical activity/exercise needs.;Develop an individualized exercise prescription for aerobic and resistive training based on initial evaluation findings, risk stratification, comorbidities and participant's personal goals.       Expected Outcomes Short Term: Increase workloads from initial exercise prescription for resistance, speed, and METs.;Short Term: Perform resistance training exercises routinely during rehab and add in resistance training at home;Long Term: Improve cardiorespiratory fitness, muscular endurance and strength as measured by increased METs and functional  capacity ( )       Able to understand and use rate of perceived exertion (RPE) scale Yes       Intervention Provide education and explanation on how to use RPE scale       Expected Outcomes Short Term: Able to use RPE daily in rehab to express subjective intensity level;Long  Term:  Able to use RPE to guide intensity level when exercising independently       Knowledge and understanding of Target Heart Rate Range (THRR) Yes       Intervention Provide education and explanation of THRR including how the numbers were predicted and where they are located for reference       Expected Outcomes Short Term: Able to state/look up THRR;Short Term: Able to use daily as guideline for intensity in rehab;Long Term: Able to use THRR to govern intensity when exercising independently       Understanding of Exercise Prescription Yes       Intervention Provide education, explanation, and written materials on patient's individual exercise prescription       Expected Outcomes Short Term: Able to explain program exercise prescription;Long Term: Able to explain home exercise prescription to exercise independently                Exercise Goals Re-Evaluation :   Discharge Exercise Prescription (Final Exercise Prescription Changes):   Nutrition:  Target Goals: Understanding of nutrition guidelines, daily intake of sodium 1500mg , cholesterol 200mg , calories 30% from fat and 7% or less from saturated fats, daily to have 5 or more servings of fruits and vegetables.  Biometrics:  Pre Biometrics - 02/14/23 1115       Pre Biometrics   Waist Circumference 44.5 inches    Hip Circumference 47 inches    Waist to Hip Ratio 0.95 %    Triceps Skinfold 20 mm    % Body Fat 33.3 %    Grip Strength 40 kg    Flexibility 9 in    Single Leg Stand 5.18 seconds              Nutrition Therapy Plan and Nutrition Goals:   Nutrition Assessments:  MEDIFICTS Score Key: >=70 Need to make dietary changes  40-70  Heart Healthy Diet <= 40 Therapeutic Level Cholesterol Diet    Picture Your Plate Scores: <40 Unhealthy dietary pattern with much room for improvement. 41-50 Dietary pattern unlikely to meet recommendations for good health and room for improvement. 51-60 More healthful dietary pattern, with some room for improvement.  >60 Healthy dietary pattern, although there may be some specific behaviors that could be improved.    Nutrition Goals Re-Evaluation:   Nutrition Goals Re-Evaluation:   Nutrition Goals Discharge (Final Nutrition Goals Re-Evaluation):   Psychosocial: Target Goals: Acknowledge presence or absence of significant depression and/or stress, maximize coping skills, provide positive support system. Participant is able to verbalize types and ability to use techniques and skills needed for reducing stress and depression.  Initial Review & Psychosocial Screening:  Initial Psych Review & Screening - 02/14/23 1301       Initial Review   Current issues with None Identified;Current Stress Concerns    Source of Stress Concerns Occupation    Comments Voices stress level is low      Family Dynamics   Good Support System? --   Pt has spouse, son and daughter for support     Barriers   Psychosocial barriers to participate in program There are no identifiable barriers or psychosocial needs.      Screening Interventions   Interventions Encouraged to exercise             Quality of Life Scores:  Quality of Life - 02/14/23 1304       Quality of Life   Select Quality of Life      Quality of  Life Scores   Health/Function Pre 29.08 %    Socioeconomic Pre 30 %    Psych/Spiritual Pre 30 %    Family Pre 30 %    GLOBAL Pre 29.63 %            Scores of 19 and below usually indicate a poorer quality of life in these areas.  A difference of  2-3 points is a clinically meaningful difference.  A difference of 2-3 points in the total score of the Quality of Life Index has  been associated with significant improvement in overall quality of life, self-image, physical symptoms, and general health in studies assessing change in quality of life.  PHQ-9: Review Flowsheet       02/14/2023  Depression screen PHQ 2/9  Decreased Interest 0  Down, Depressed, Hopeless 0  PHQ - 2 Score 0  Altered sleeping 0  Tired, decreased energy 0  Change in appetite 0  Feeling bad or failure about yourself  0  Trouble concentrating 0  Moving slowly or fidgety/restless 0  Suicidal thoughts 0  PHQ-9 Score 0  Difficult doing work/chores Not difficult at all   Interpretation of Total Score  Total Score Depression Severity:  1-4 = Minimal depression, 5-9 = Mild depression, 10-14 = Moderate depression, 15-19 = Moderately severe depression, 20-27 = Severe depression   Psychosocial Evaluation and Intervention:   Psychosocial Re-Evaluation:   Psychosocial Discharge (Final Psychosocial Re-Evaluation):   Vocational Rehabilitation: Provide vocational rehab assistance to qualifying candidates.   Vocational Rehab Evaluation & Intervention:  Vocational Rehab - 02/14/23 1305       Initial Vocational Rehab Evaluation & Intervention   Assessment shows need for Vocational Rehabilitation No   Pt is currently working            Education: Education Goals: Education classes will be provided on a weekly basis, covering required topics. Participant will state understanding/return demonstration of topics presented.     Core Videos: Exercise    Move It!  Clinical staff conducted group or individual video education with verbal and written material and guidebook.  Patient learns the recommended Pritikin exercise program. Exercise with the goal of living a long, healthy life. Some of the health benefits of exercise include controlled diabetes, healthier blood pressure levels, improved cholesterol levels, improved heart and lung capacity, improved sleep, and better body  composition. Everyone should speak with their doctor before starting or changing an exercise routine.  Biomechanical Limitations Clinical staff conducted group or individual video education with verbal and written material and guidebook.  Patient learns how biomechanical limitations can impact exercise and how we can mitigate and possibly overcome limitations to have an impactful and balanced exercise routine.  Body Composition Clinical staff conducted group or individual video education with verbal and written material and guidebook.  Patient learns that body composition (ratio of muscle mass to fat mass) is a key component to assessing overall fitness, rather than body weight alone. Increased fat mass, especially visceral belly fat, can put [place] at increased risk for metabolic syndrome, type 2 diabetes, heart disease, and even death. It is recommended to combine diet and exercise (cardiovascular and resistance training) to improve your body composition. Seek guidance from your physician and exercise physiologist before implementing an exercise routine.  Exercise Action Plan Clinical staff conducted group or individual video education with verbal and written material and guidebook.  Patient learns the recommended strategies to achieve and enjoy long-term exercise adherence, including variety, self-motivation, self-efficacy, and positive decision  making. Benefits of exercise include fitness, good health, weight management, more energy, better sleep, less stress, and overall well-being.  Medical   Heart Disease Risk Reduction Clinical staff conducted group or individual video education with verbal and written material and guidebook.  Patient learns our heart is our most vital organ as it circulates oxygen, nutrients, white blood cells, and hormones throughout the entire body, and carries waste away. Data supports a plant-based eating plan like the Pritikin Program for its effectiveness in slowing  progression of and reversing heart disease. The video provides a number of recommendations to address heart disease.   Metabolic Syndrome and Belly Fat  Clinical staff conducted group or individual video education with verbal and written material and guidebook.  Patient learns what metabolic syndrome is, how it leads to heart disease, and how one can reverse it and keep it from coming back. You have metabolic syndrome if you have 3 of the following 5 criteria: abdominal obesity, high blood pressure, high triglycerides, low HDL cholesterol, and high blood sugar.  Hypertension and Heart Disease Clinical staff conducted group or individual video education with verbal and written material and guidebook.  Patient learns that high blood pressure, or hypertension, is very common in the [place]. Hypertension is largely due to excessive salt intake, but other important risk factors include being overweight, physical inactivity, drinking too much alcohol, smoking, and not eating enough potassium from fruits and vegetables. High blood pressure is a leading risk factor for heart attack, stroke, congestive heart failure, dementia, kidney failure, and premature death. Long-term effects of excessive salt intake include stiffening of the arteries and thickening of heart muscle and organ damage. Recommendations include ways to reduce hypertension and the risk of heart disease.  Diseases of Our Time - Focusing on Diabetes Clinical staff conducted group or individual video education with verbal and written material and guidebook.  Patient learns why the best way to stop diseases of our time is prevention, through food and other lifestyle changes. Medicine (such as prescription pills and surgeries) is often only a Band-Aid on the problem, not a long-term solution. Most common diseases of our time include obesity, type 2 diabetes, hypertension, heart disease, and cancer. The Pritikin Program is recommended and has been  proven to help reduce, reverse, and/or prevent the damaging effects of metabolic syndrome.  Nutrition   Overview of the Pritikin Eating Plan  Clinical staff conducted group or individual video education with verbal and written material and guidebook.  Patient learns about the Pritikin Eating Plan for disease risk reduction. The Pritikin Eating Plan emphasizes a wide variety of unrefined, minimally-processed carbohydrates, like fruits, vegetables, whole grains, and legumes. Go, Caution, and Stop food choices are explained. Plant-based and lean animal proteins are emphasized. Rationale provided for low sodium intake for blood pressure control, low added sugars for blood sugar stabilization, and low added fats and oils for coronary artery disease risk reduction and weight management.  Calorie Density  Clinical staff conducted group or individual video education with verbal and written material and guidebook.  Patient learns about calorie density and how it impacts the Pritikin Eating Plan. Knowing the characteristics of the food you choose will help you decide whether those foods will lead to weight gain or weight loss, and whether you want to consume more or less of them. Weight loss is usually a side effect of the Pritikin Eating Plan because of its focus on low calorie-dense foods.  Label Reading  Clinical staff conducted group or  individual video education with verbal and written material and guidebook.  Patient learns about the Pritikin recommended label reading guidelines and corresponding recommendations regarding calorie density, added sugars, sodium content, and whole grains.  Dining Out - Part 1  Clinical staff conducted group or individual video education with verbal and written material and guidebook.  Patient learns that restaurant meals can be sabotaging because they can be so high in calories, fat, sodium, and/or sugar. Patient learns recommended strategies on how to positively address  this and avoid unhealthy pitfalls.  Facts on Fats  Clinical staff conducted group or individual video education with verbal and written material and guidebook.  Patient learns that lifestyle modifications can be just as effective, if not more so, as many medications for lowering your risk of heart disease. A Pritikin lifestyle can help to reduce your risk of inflammation and atherosclerosis (cholesterol build-up, or plaque, in the artery walls). Lifestyle interventions such as dietary choices and physical activity address the cause of atherosclerosis. A review of the types of fats and their impact on blood cholesterol levels, along with dietary recommendations to reduce fat intake is also included.  Nutrition Action Plan  Clinical staff conducted group or individual video education with verbal and written material and guidebook.  Patient learns how to incorporate Pritikin recommendations into their lifestyle. Recommendations include planning and keeping personal health goals in mind as an important part of their success.  Healthy Mind-Set    Healthy Minds, Bodies, Hearts  Clinical staff conducted group or individual video education with verbal and written material and guidebook.  Patient learns how to identify when they are stressed. Video will discuss the impact of that stress, as well as the many benefits of stress management. Patient will also be introduced to stress management techniques. The way we think, act, and feel has an impact on our hearts.  How Our Thoughts Can Heal Our Hearts  Clinical staff conducted group or individual video education with verbal and written material and guidebook.  Patient learns that negative thoughts can cause depression and anxiety. This can result in negative lifestyle behavior and serious health problems. Cognitive behavioral therapy is an effective method to help control our thoughts in order to change and improve our emotional outlook.  Additional  Videos:  Exercise    Improving Performance  Clinical staff conducted group or individual video education with verbal and written material and guidebook.  Patient learns to use a non-linear approach by alternating intensity levels and lengths of time spent exercising to help burn more calories and lose more body fat. Cardiovascular exercise helps improve heart health, metabolism, hormonal balance, blood sugar control, and recovery from fatigue. Resistance training improves strength, endurance, balance, coordination, reaction time, metabolism, and muscle mass. Flexibility exercise improves circulation, posture, and balance. Seek guidance from your physician and exercise physiologist before implementing an exercise routine and learn your capabilities and proper form for all exercise.  Introduction to Yoga  Clinical staff conducted group or individual video education with verbal and written material and guidebook.  Patient learns about yoga, a discipline of the coming together of mind, breath, and body. The benefits of yoga include improved flexibility, improved range of motion, better posture and core strength, increased lung function, weight loss, and positive self-image. Yoga's heart health benefits include lowered blood pressure, healthier heart rate, decreased cholesterol and triglyceride levels, improved immune function, and reduced stress. Seek guidance from your physician and exercise physiologist before implementing an exercise routine and learn your capabilities and  proper form for all exercise.  Medical   Aging: Enhancing Your Quality of Life  Clinical staff conducted group or individual video education with verbal and written material and guidebook.  Patient learns key strategies and recommendations to stay in good physical health and enhance quality of life, such as prevention strategies, having an advocate, securing a Health Care Proxy and Power of Attorney, and keeping a list of medications  and system for tracking them. It also discusses how to avoid risk for bone loss.  Biology of Weight Control  Clinical staff conducted group or individual video education with verbal and written material and guidebook.  Patient learns that weight gain occurs because we consume more calories than we burn (eating more, moving less). Even if your body weight is normal, you may have higher ratios of fat compared to muscle mass. Too much body fat puts you at increased risk for cardiovascular disease, heart attack, stroke, type 2 diabetes, and obesity-related cancers. In addition to exercise, following the Pritikin Eating Plan can help reduce your risk.  Decoding Lab Results  Clinical staff conducted group or individual video education with verbal and written material and guidebook.  Patient learns that lab test reflects one measurement whose values change over time and are influenced by many factors, including medication, stress, sleep, exercise, food, hydration, pre-existing medical conditions, and more. It is recommended to use the knowledge from this video to become more involved with your lab results and evaluate your numbers to speak with your doctor.   Diseases of Our Time - Overview  Clinical staff conducted group or individual video education with verbal and written material and guidebook.  Patient learns that according to the CDC, 50% to 70% of chronic diseases (such as obesity, type 2 diabetes, elevated lipids, hypertension, and heart disease) are avoidable through lifestyle improvements including healthier food choices, listening to satiety cues, and increased physical activity.  Sleep Disorders Clinical staff conducted group or individual video education with verbal and written material and guidebook.  Patient learns how good quality and duration of sleep are important to overall health and well-being. Patient also learns about sleep disorders and how they impact health along with  recommendations to address them, including discussing with a physician.  Nutrition  Dining Out - Part 2 Clinical staff conducted group or individual video education with verbal and written material and guidebook.  Patient learns how to plan ahead and communicate in order to maximize their dining experience in a healthy and nutritious manner. Included are recommended food choices based on the type of restaurant the patient is visiting.   Fueling a Banker conducted group or individual video education with verbal and written material and guidebook.  There is a strong connection between our food choices and our health. Diseases like obesity and type 2 diabetes are very prevalent and are in large-part due to lifestyle choices. The Pritikin Eating Plan provides plenty of food and hunger-curbing satisfaction. It is easy to follow, affordable, and helps reduce health risks.  Menu Workshop  Clinical staff conducted group or individual video education with verbal and written material and guidebook.  Patient learns that restaurant meals can sabotage health goals because they are often packed with calories, fat, sodium, and sugar. Recommendations include strategies to plan ahead and to communicate with the manager, chef, or server to help order a healthier meal.  Planning Your Eating Strategy  Clinical staff conducted group or individual video education with verbal and written material and  guidebook.  Patient learns about the Pritikin Eating Plan and its benefit of reducing the risk of disease. The Pritikin Eating Plan does not focus on calories. Instead, it emphasizes high-quality, nutrient-rich foods. By knowing the characteristics of the foods, we choose, we can determine their calorie density and make informed decisions.  Targeting Your Nutrition Priorities  Clinical staff conducted group or individual video education with verbal and written material and guidebook.  Patient learns  that lifestyle habits have a tremendous impact on disease risk and progression. This video provides eating and physical activity recommendations based on your personal health goals, such as reducing LDL cholesterol, losing weight, preventing or controlling type 2 diabetes, and reducing high blood pressure.  Vitamins and Minerals  Clinical staff conducted group or individual video education with verbal and written material and guidebook.  Patient learns different ways to obtain key vitamins and minerals, including through a recommended healthy diet. It is important to discuss all supplements you take with your doctor.   Healthy Mind-Set    Smoking Cessation  Clinical staff conducted group or individual video education with verbal and written material and guidebook.  Patient learns that cigarette smoking and tobacco addiction pose a serious health risk which affects millions of people. Stopping smoking will significantly reduce the risk of heart disease, lung disease, and many forms of cancer. Recommended strategies for quitting are covered, including working with your doctor to develop a successful plan.  Culinary   Becoming a Set designer conducted group or individual video education with verbal and written material and guidebook.  Patient learns that cooking at home can be healthy, cost-effective, quick, and puts them in control. Keys to cooking healthy recipes will include looking at your recipe, assessing your equipment needs, planning ahead, making it simple, choosing cost-effective seasonal ingredients, and limiting the use of added fats, salts, and sugars.  Cooking - Breakfast and Snacks  Clinical staff conducted group or individual video education with verbal and written material and guidebook.  Patient learns how important breakfast is to satiety and nutrition through the entire day. Recommendations include key foods to eat during breakfast to help stabilize blood sugar  levels and to prevent overeating at meals later in the day. Planning ahead is also a key component.  Cooking - Educational psychologist conducted group or individual video education with verbal and written material and guidebook.  Patient learns eating strategies to improve overall health, including an approach to cook more at home. Recommendations include thinking of animal protein as a side on your plate rather than center stage and focusing instead on lower calorie dense options like vegetables, fruits, whole grains, and plant-based proteins, such as beans. Making sauces in large quantities to freeze for later and leaving the skin on your vegetables are also recommended to maximize your experience.  Cooking - Healthy Salads and Dressing Clinical staff conducted group or individual video education with verbal and written material and guidebook.  Patient learns that vegetables, fruits, whole grains, and legumes are the foundations of the Pritikin Eating Plan. Recommendations include how to incorporate each of these in flavorful and healthy salads, and how to create homemade salad dressings. Proper handling of ingredients is also covered. Cooking - Soups and [employer] - Soups and Desserts Clinical staff conducted group or individual video education with verbal and written material and guidebook.  Patient learns that Pritikin soups and desserts make for easy, nutritious, and delicious snacks and meal components that  are low in sodium, fat, sugar, and calorie density, while high in vitamins, minerals, and filling fiber. Recommendations include simple and healthy ideas for soups and desserts.   Overview     The Pritikin Solution Program Overview Clinical staff conducted group or individual video education with verbal and written material and guidebook.  Patient learns that the results of the Pritikin Program have been documented in more than 100 articles published in peer-reviewed  journals, and the benefits include reducing risk factors for (and, in some cases, even reversing) high cholesterol, high blood pressure, type 2 diabetes, obesity, and more! An overview of the three key pillars of the Pritikin Program will be covered: eating well, doing regular exercise, and having a healthy mind-set.  WORKSHOPS  Exercise: Exercise Basics: Building Your Action Plan Clinical staff led group instruction and group discussion with PowerPoint presentation and patient guidebook. To enhance the learning environment the use of posters, models and videos may be added. At the conclusion of this workshop, patients will comprehend the difference between physical activity and exercise, as well as the benefits of incorporating both, into their routine. Patients will understand the FITT (Frequency, Intensity, Time, and Type) principle and how to use it to build an exercise action plan. In addition, safety concerns and other considerations for exercise and cardiac rehab will be addressed by the presenter. The purpose of this lesson is to promote a comprehensive and effective weekly exercise routine in order to improve patients' overall level of fitness.   Managing Heart Disease: Your Path to a Healthier Heart Clinical staff led group instruction and group discussion with PowerPoint presentation and patient guidebook. To enhance the learning environment the use of posters, models and videos may be added.At the conclusion of this workshop, patients will understand the anatomy and physiology of the heart. Additionally, they will understand how Pritikin's three pillars impact the risk factors, the progression, and the management of heart disease.  The purpose of this lesson is to provide a high-level overview of the heart, heart disease, and how the Pritikin lifestyle positively impacts risk factors.  Exercise Biomechanics Clinical staff led group instruction and group discussion with PowerPoint  presentation and patient guidebook. To enhance the learning environment the use of posters, models and videos may be added. Patients will learn how the structural parts of their bodies function and how these functions impact their daily activities, movement, and exercise. Patients will learn how to promote a neutral spine, learn how to manage pain, and identify ways to improve their physical movement in order to promote healthy living. The purpose of this lesson is to expose patients to common physical limitations that impact physical activity. Participants will learn practical ways to adapt and manage aches and pains, and to minimize their effect on regular exercise. Patients will learn how to maintain good posture while sitting, walking, and lifting.  Balance Training and Fall Prevention  Clinical staff led group instruction and group discussion with PowerPoint presentation and patient guidebook. To enhance the learning environment the use of posters, models and videos may be added. At the conclusion of this workshop, patients will understand the importance of their sensorimotor skills (vision, proprioception, and the vestibular system) in maintaining their ability to balance as they age. Patients will apply a variety of balancing exercises that are appropriate for their current level of function. Patients will understand the common causes for poor balance, possible solutions to these problems, and ways to modify their physical environment in order to minimize their fall  risk. The purpose of this lesson is to teach patients about the importance of maintaining balance as they age and ways to minimize their risk of falling.  WORKSHOPS   Nutrition:  Fueling a Ship broker led group instruction and group discussion with PowerPoint presentation and patient guidebook. To enhance the learning environment the use of posters, models and videos may be added. Patients will review the  foundational principles of the Pritikin Eating Plan and understand what constitutes a serving size in each of the food groups. Patients will also learn Pritikin-friendly foods that are better choices when away from home and review make-ahead meal and snack options. Calorie density will be reviewed and applied to three nutrition priorities: weight maintenance, weight loss, and weight gain. The purpose of this lesson is to reinforce (in a group setting) the key concepts around what patients are recommended to eat and how to apply these guidelines when away from home by planning and selecting Pritikin-friendly options. Patients will understand how calorie density may be adjusted for different weight management goals.  Mindful Eating  Clinical staff led group instruction and group discussion with PowerPoint presentation and patient guidebook. To enhance the learning environment the use of posters, models and videos may be added. Patients will briefly review the concepts of the Pritikin Eating Plan and the importance of low-calorie dense foods. The concept of mindful eating will be introduced as well as the importance of paying attention to internal hunger signals. Triggers for non-hunger eating and techniques for dealing with triggers will be explored. The purpose of this lesson is to provide patients with the opportunity to review the basic principles of the Pritikin Eating Plan, discuss the value of eating mindfully and how to measure internal cues of hunger and fullness using the Hunger Scale. Patients will also discuss reasons for non-hunger eating and learn strategies to use for controlling emotional eating.  Targeting Your Nutrition Priorities Clinical staff led group instruction and group discussion with PowerPoint presentation and patient guidebook. To enhance the learning environment the use of posters, models and videos may be added. Patients will learn how to determine their genetic susceptibility to  disease by reviewing their family history. Patients will gain insight into the importance of diet as part of an overall healthy lifestyle in mitigating the impact of genetics and other environmental insults. The purpose of this lesson is to provide patients with the opportunity to assess their personal nutrition priorities by looking at their family history, their own health history and current risk factors. Patients will also be able to discuss ways of prioritizing and modifying the Pritikin Eating Plan for their highest risk areas  Menu  Clinical staff led group instruction and group discussion with PowerPoint presentation and patient guidebook. To enhance the learning environment the use of posters, models and videos may be added. Using menus brought in from [employer], or printed from [employer], patients will apply the Pritikin dining out guidelines that were presented in the [employer] video. Patients will also be able to practice these guidelines in a variety of provided scenarios. The purpose of this lesson is to provide patients with the opportunity to practice hands-on learning of the Pritikin Dining Out guidelines with actual menus and practice scenarios.  Label Reading Clinical staff led group instruction and group discussion with PowerPoint presentation and patient guidebook. To enhance the learning environment the use of posters, models and videos may be added. Patients will review and discuss the Pritikin label  reading guidelines presented in Pritikin's Label Reading Educational series video. Using fool labels brought in from local grocery stores and markets, patients will apply the label reading guidelines and determine if the packaged food meet the Pritikin guidelines. The purpose of this lesson is to provide patients with the opportunity to review, discuss, and practice hands-on learning of the Pritikin Label Reading guidelines with actual packaged food  labels. Cooking School  Pritikin's [employer] are designed to teach patients ways to prepare quick, simple, and affordable recipes at home. The importance of nutrition's role in chronic disease risk reduction is reflected in its emphasis in the overall Pritikin program. By learning how to prepare essential core Pritikin Eating Plan recipes, patients will increase control over what they eat; be able to customize the flavor of foods without the use of added salt, sugar, or fat; and improve the quality of the food they consume. By learning a set of core recipes which are easily assembled, quickly prepared, and affordable, patients are more likely to prepare more healthy foods at home. These workshops focus on convenient breakfasts, simple entres, side dishes, and desserts which can be prepared with minimal effort and are consistent with nutrition recommendations for cardiovascular risk reduction. Cooking [employer] are taught by a Armed forces logistics/support/administrative officer (RD) who has been trained by the [employer]. The chef or RD has a clear understanding of the importance of minimizing - if not completely eliminating - added fat, sugar, and sodium in recipes. Throughout the series of Cooking School Workshop sessions, patients will learn about healthy ingredients and efficient methods of cooking to build confidence in their capability to prepare    Cooking School weekly topics:  Adding Flavor- Sodium-Free  Fast and Healthy Breakfasts  Powerhouse Plant-Based Proteins  Satisfying Salads and Dressings  Simple Sides and Sauces  International Cuisine-Spotlight on the [employer] Zones  Delicious Desserts  Savory Soups  [employer] - Meals in a Astronomer Appetizers and Snacks  Comforting Weekend Breakfasts  One-Pot Wonders   Fast Evening Meals  Landscape architect Your Pritikin Plate  WORKSHOPS   Healthy Mindset (Psychosocial):  Focused Goals, Sustainable  Changes Clinical staff led group instruction and group discussion with PowerPoint presentation and patient guidebook. To enhance the learning environment the use of posters, models and videos may be added. Patients will be able to apply effective goal setting strategies to establish at least one personal goal, and then take consistent, meaningful action toward that goal. They will learn to identify common barriers to achieving personal goals and develop strategies to overcome them. Patients will also gain an understanding of how our mind-set can impact our ability to achieve goals and the importance of cultivating a positive and growth-oriented mind-set. The purpose of this lesson is to provide patients with a deeper understanding of how to set and achieve personal goals, as well as the tools and strategies needed to overcome common obstacles which may arise along the way.  From Head to Heart: The Power of a Healthy Outlook  Clinical staff led group instruction and group discussion with PowerPoint presentation and patient guidebook. To enhance the learning environment the use of posters, models and videos may be added. Patients will be able to recognize and describe the impact of emotions and mood on physical health. They will discover the importance of self-care and explore self-care practices which may work for them. Patients will also learn how to utilize the 4 C's to cultivate a  healthier outlook and better manage stress and challenges. The purpose of this lesson is to demonstrate to patients how a healthy outlook is an essential part of maintaining good health, especially as they continue their cardiac rehab journey.  Healthy Sleep for a Healthy Heart Clinical staff led group instruction and group discussion with PowerPoint presentation and patient guidebook. To enhance the learning environment the use of posters, models and videos may be added. At the conclusion of this workshop, patients will be able  to demonstrate knowledge of the importance of sleep to overall health, well-being, and quality of life. They will understand the symptoms of, and treatments for, common sleep disorders. Patients will also be able to identify daytime and nighttime behaviors which impact sleep, and they will be able to apply these tools to help manage sleep-related challenges. The purpose of this lesson is to provide patients with a general overview of sleep and outline the importance of quality sleep. Patients will learn about a few of the most common sleep disorders. Patients will also be introduced to the concept of "sleep hygiene," and discover ways to self-manage certain sleeping problems through simple daily behavior changes. Finally, the workshop will motivate patients by clarifying the links between quality sleep and their goals of heart-healthy living.   Recognizing and Reducing Stress Clinical staff led group instruction and group discussion with PowerPoint presentation and patient guidebook. To enhance the learning environment the use of posters, models and videos may be added. At the conclusion of this workshop, patients will be able to understand the types of stress reactions, differentiate between acute and chronic stress, and recognize the impact that chronic stress has on their health. They will also be able to apply different coping mechanisms, such as reframing negative self-talk. Patients will have the opportunity to practice a variety of stress management techniques, such as deep abdominal breathing, progressive muscle relaxation, and/or guided imagery.  The purpose of this lesson is to educate patients on the role of stress in their lives and to provide healthy techniques for coping with it.  Learning Barriers/Preferences:  Learning Barriers/Preferences - 02/14/23 1304       Learning Barriers/Preferences   Learning Barriers Sight;Hearing   wears glasses and hearing aids   Learning Preferences  Audio;Verbal Instruction;Computer/Internet;Video;Written Material;Group Instruction;Individual Instruction;Pictoral;Skilled Demonstration             Education Topics:  Knowledge Questionnaire Score:  Knowledge Questionnaire Score - 02/14/23 1305       Knowledge Questionnaire Score   Pre Score 19/24             Core Components/Risk Factors/Patient Goals at Admission:  Personal Goals and Risk Factors at Admission - 02/14/23 1305       Core Components/Risk Factors/Patient Goals on Admission    Weight Management Yes;Obesity;Weight Loss    Intervention Weight Management: Develop a combined nutrition and exercise program designed to reach desired caloric intake, while maintaining appropriate intake of nutrient and fiber, sodium and fats, and appropriate energy expenditure required for the weight goal.;Weight Management: Provide education and appropriate resources to help participant work on and attain dietary goals.;Weight Management/Obesity: Establish reasonable short term and long term weight goals.;Obesity: Provide education and appropriate resources to help participant work on and attain dietary goals.    Admit Weight 243 lb 9.7 oz (110.5 kg)    Expected Outcomes Short Term: Continue to assess and modify interventions until short term weight is achieved;Long Term: Adherence to nutrition and physical activity/exercise program aimed toward attainment of  established weight goal;Weight Loss: Understanding of general recommendations for a balanced deficit meal plan, which promotes 1-2 lb weight loss per week and includes a negative energy balance of [phone] kcal/d;Understanding recommendations for meals to include 15-35% energy as protein, 25-35% energy from fat, 35-60% energy from carbohydrates, less than 200mg  of dietary cholesterol, 20-35 gm of total fiber daily;Understanding of distribution of calorie intake throughout the day with the consumption of 4-5 meals/snacks    Hypertension  Yes    Intervention Provide education on lifestyle modifcations including regular physical activity/exercise, weight management, moderate sodium restriction and increased consumption of fresh fruit, vegetables, and low fat dairy, alcohol moderation, and smoking cessation.;Monitor prescription use compliance.    Expected Outcomes Short Term: Continued assessment and intervention until BP is < 140/87mm HG in hypertensive participants. < 130/57mm HG in hypertensive participants with diabetes, heart failure or chronic kidney disease.;Long Term: Maintenance of blood pressure at goal levels.    Lipids Yes    Intervention Provide education and support for participant on nutrition & aerobic/resistive exercise along with prescribed medications to achieve LDL 70mg , HDL >40mg .    Expected Outcomes Short Term: Participant states understanding of desired cholesterol values and is compliant with medications prescribed. Participant is following exercise prescription and nutrition guidelines.;Long Term: Cholesterol controlled with medications as prescribed, with individualized exercise RX and with personalized nutrition plan. Value goals: LDL < 70mg , HDL > 40 mg.    Stress Yes    Intervention Offer individual and/or small group education and counseling on adjustment to heart disease, stress management and health-related lifestyle change. Teach and support self-help strategies.    Expected Outcomes Short Term: Participant demonstrates changes in health-related behavior, relaxation and other stress management skills, ability to obtain effective social support, and compliance with psychotropic medications if prescribed.;Long Term: Emotional wellbeing is indicated by absence of clinically significant psychosocial distress or social isolation.             Core Components/Risk Factors/Patient Goals Review:    Core Components/Risk Factors/Patient Goals at Discharge (Final Review):    ITP Comments:  ITP Comments      Row Name 02/14/23 1103           ITP Comments Dr. Armanda Magic medical director. Introduction to pritikin education/intensive cardiac rehab. Initial orientation packet reviewed with patient.               Comments: Participant attended orientation for the cardiac rehabilitation program on  02/14/2023  to perform initial intake and exercise walk test. Patient introduced to the Pritikin Program education and orientation packet was reviewed. Completed 6-minute walk test, measurements, initial ITP, and exercise prescription. Vital signs stable. Telemetry-normal sinus rhythm, PAC's, PVC's, asymptomatic.   Service time was from 10:30 to 13:00.

## 2023-02-18 ENCOUNTER — Encounter (HOSPITAL_COMMUNITY)
Admission: RE | Admit: 2023-02-18 | Discharge: 2023-02-18 | Disposition: A | Discharge status: Home / Self Care | Payer: Medicare Other | Source: Ambulatory Visit | Attending: Cardiovascular Disease | Admitting: Cardiovascular Disease

## 2023-02-18 DIAGNOSIS — Z955 Presence of coronary angioplasty implant and graft: Secondary | ICD-10-CM | POA: Diagnosis not present

## 2023-02-18 NOTE — Progress Notes (Signed)
Daily Session Note  Patient Details  Name: Kevin Caldwell. MRN: [id number] Date of Birth: 1946-02-23 Referring Provider:   Flowsheet Row INTENSIVE CARDIAC REHAB ORIENT from 02/14/2023 in [facility]  Referring Provider Kristeen Miss, MD       Encounter Date: 02/18/2023  Check In:  Session Check In - 02/18/23 0756       Check-In   Supervising physician immediately available to respond to emergencies [facility] MD immediately available    Physician(s) Bernadene Person, NP    Location [facility]    Staff Present Lorin Picket, MS, ACSM-CEP, CCRP, Exercise Physiologist;Olinty Peggye Pitt, MS, ACSM-CEP, Exercise Physiologist;Brette Cast, RN, BSN;Johnny Hale Bogus, MS, Exercise Physiologist;Jetta Dan Humphreys BS, ACSM-CEP, Exercise Physiologist;Bailey Wallace Cullens, MS, Exercise Physiologist    Virtual Visit No    Medication changes reported     No    Fall or balance concerns reported    No    Tobacco Cessation No Change    Warm-up and Cool-down Performed as group-led instruction    Resistance Training Performed Yes    VAD Patient? No    PAD/SET Patient? No      Pain Assessment   Currently in Pain? No/denies    Pain Score 0-No pain    Multiple Pain Sites No             Capillary Blood Glucose: No results found for this or any previous visit (from the past 24 hours).   Exercise Prescription Changes - 02/18/23 1400       Response to Exercise   Blood Pressure (Admit) 100/56    Blood Pressure (Exercise) 108/70    Blood Pressure (Exit) 102/60    Heart Rate (Admit) 61 bpm    Heart Rate (Exercise) 125 bpm    Heart Rate (Exit) 77 bpm    Rating of Perceived Exertion (Exercise) 11    Symptoms None    Comments Pt's first day in the CRP2 program    Duration Continue with 30 min of aerobic exercise without signs/symptoms of physical distress.    Intensity THRR unchanged      Progression   Progression Continue to progress  workloads to maintain intensity without signs/symptoms of physical distress.    Average METs 3      Resistance Training   Training Prescription Yes    Weight 3 lbs    Reps 10-15    Time 10 Minutes      Interval Training   Interval Training No      Bike   Level 2    Watts 33    Minutes 15    METs 2.8      Recumbant Elliptical   Level 2    RPM 66    Watts 98    Minutes 15    METs 3.2             Social History   Tobacco Use  Smoking Status Never  Smokeless Tobacco Never    Goals Met:  Exercise tolerated well Strength training completed today  Goals Unmet:  Not Applicable  Comments: Pt started cardiac rehab today.  Pt tolerated light exercise without difficulty. VSS, telemetry-Sinus Rhythm,bundle branch block asymptomatic.  Medication list reconciled. Pt denies barriers to medicaiton compliance.  PSYCHOSOCIAL ASSESSMENT:  PHQ-0. Pt exhibits positive coping skills, hopeful outlook with supportive family. No psychosocial needs identified at this time, no psychosocial interventions necessary.    Pt enjoys traveling and spending time  with his family.   Pt oriented to exercise equipment and routine.    Understanding verbalized. Thayer Headings RN BSN    Dr. Armanda Magic is Medical Director for Cardiac Rehab at [facility].

## 2023-02-20 ENCOUNTER — Encounter (HOSPITAL_COMMUNITY)
Admission: RE | Admit: 2023-02-20 | Discharge: 2023-02-20 | Disposition: A | Discharge status: Home / Self Care | Payer: Medicare Other | Source: Ambulatory Visit | Attending: Cardiovascular Disease | Admitting: Cardiovascular Disease

## 2023-02-20 DIAGNOSIS — Z955 Presence of coronary angioplasty implant and graft: Secondary | ICD-10-CM | POA: Diagnosis not present

## 2023-02-22 ENCOUNTER — Encounter (HOSPITAL_COMMUNITY)
Admission: RE | Admit: 2023-02-22 | Discharge: 2023-02-22 | Disposition: A | Discharge status: Home / Self Care | Payer: Medicare Other | Source: Ambulatory Visit | Attending: Cardiovascular Disease | Admitting: Cardiovascular Disease

## 2023-02-22 DIAGNOSIS — Z955 Presence of coronary angioplasty implant and graft: Secondary | ICD-10-CM

## 2023-02-23 ENCOUNTER — Other Ambulatory Visit (HOSPITAL_COMMUNITY): Payer: Self-pay

## 2023-02-25 ENCOUNTER — Encounter (HOSPITAL_COMMUNITY)
Admission: RE | Admit: 2023-02-25 | Discharge: 2023-02-25 | Disposition: A | Discharge status: Home / Self Care | Payer: Medicare Other | Source: Ambulatory Visit | Attending: Cardiovascular Disease | Admitting: Cardiovascular Disease

## 2023-02-25 DIAGNOSIS — Z955 Presence of coronary angioplasty implant and graft: Secondary | ICD-10-CM

## 2023-02-26 ENCOUNTER — Other Ambulatory Visit (HOSPITAL_COMMUNITY): Payer: Self-pay

## 2023-03-01 ENCOUNTER — Encounter (HOSPITAL_COMMUNITY)
Admission: RE | Admit: 2023-03-01 | Discharge: 2023-03-01 | Disposition: A | Discharge status: Home / Self Care | Payer: Medicare Other | Source: Ambulatory Visit | Attending: Cardiovascular Disease | Admitting: Cardiovascular Disease

## 2023-03-01 DIAGNOSIS — Z955 Presence of coronary angioplasty implant and graft: Secondary | ICD-10-CM

## 2023-03-04 ENCOUNTER — Encounter (HOSPITAL_COMMUNITY)
Admission: RE | Admit: 2023-03-04 | Discharge: 2023-03-04 | Disposition: A | Discharge status: Home / Self Care | Payer: Medicare Other | Source: Ambulatory Visit | Attending: Cardiovascular Disease | Admitting: Cardiovascular Disease

## 2023-03-04 DIAGNOSIS — Z955 Presence of coronary angioplasty implant and graft: Secondary | ICD-10-CM | POA: Diagnosis not present

## 2023-03-05 ENCOUNTER — Telehealth (HOSPITAL_COMMUNITY): Payer: Self-pay

## 2023-03-05 NOTE — Telephone Encounter (Signed)
-----   Message from Aleene Passe sent at 03/05/2023  9:56 AM EST ----- Regarding: RE: Increase in [facility] for you patient Kevin Caldwell to increase his target to 90% predicted max  Thanks   PN ----- Message ----- From: Janann Lenis Sent: 03/05/2023   9:54 AM EST To: Hadassah LELON Quan, RN; Aleene JINNY Passe, MD Subject: Increase in [facility] for you patient Kevin Caldwell#  Good morning Dr. Passe,  You patient Kevin Caldwell has been exercising here in the CRP2 program for 2 weeks. He has had no complaints. He is however exceeding his THRR of 57-114 (40-80%). He would like to increase workloads, but is not able to due to the THR of 114. I am requesting an increase in his THR; 85% would be 122 bpm, 90% would be 129 (I will defer which to you).   His blood pressures have been in the low normal range ranging from 94-122/56-68 at rest; and 108-154/62-72 with exercise.  Please let me know if you require any additional information. Thank you for considering this request.   Regards,  Lenis Janann MS, ACSM-CEP, CCRP

## 2023-03-05 NOTE — Telephone Encounter (Signed)
-----   Message from Aleene Passe sent at 03/05/2023  9:56 AM EST ----- Regarding: RE: Increase in [facility] for you patient Sigifredo Pignato to increase his target to 90% predicted max  Thanks   PN ----- Message ----- From: Janann Lenis Sent: 03/05/2023   9:54 AM EST To: Hadassah LELON Quan, RN; Aleene JINNY Passe, MD Subject: Increase in [facility] for you patient Lamar [name]#  Good morning Dr. Passe,  You patient Undra Harriman has been exercising here in the CRP2 program for 2 weeks. He has had no complaints. He is however exceeding his THRR of 57-114 (40-80%). He would like to increase workloads, but is not able to due to the THR of 114. I am requesting an increase in his THR; 85% would be 122 bpm, 90% would be 129 (I will defer which to you).   His blood pressures have been in the low normal range ranging from 94-122/56-68 at rest; and 108-154/62-72 with exercise.  Please let me know if you require any additional information. Thank you for considering this request.   Regards,  Lenis Janann MS, ACSM-CEP, CCRP

## 2023-03-08 ENCOUNTER — Encounter (HOSPITAL_COMMUNITY)
Admission: RE | Admit: 2023-03-08 | Discharge: 2023-03-08 | Disposition: A | Discharge status: Home / Self Care | Payer: Medicare Other | Source: Ambulatory Visit | Attending: Cardiovascular Disease | Admitting: Cardiovascular Disease

## 2023-03-08 DIAGNOSIS — Z955 Presence of coronary angioplasty implant and graft: Secondary | ICD-10-CM | POA: Insufficient documentation

## 2023-03-08 NOTE — Progress Notes (Signed)
 Patient reported he felt light headed yesterday afternoon while bending over to pick something up. Denies feeling lightheaded today. Entry blood pressure 112/70 sitting. Standing blood pressure 110/ 70. Kevin Caldwell says that he drinks about 30 ounces of water and has been intermittent fasting for the past 6 months he is fasting today. Patient was given graham crackers. Encouraged [name] to drink at least 40-60 ounces of water daily and not to fast prior to exercise at cardiac rehab. Weight today 109.7 kg. Weight at orientation 110.5 kg. Patient was also counseled by on site dietitian [facility]. Blood pressure on airdyne 104/70. Medications reviewed. Taking as prescribed.Discussed with onsite provider Rosaline Bane NP.  Per Rosaline Bane NP   if you will just note it in his chart and we can continue to monitor until he sees Dr. Alveta in march. thank you!   Will continue to monitor the patient throughout  the program.  [name] RN BSN

## 2023-03-08 NOTE — Progress Notes (Signed)
 Cardiac Individual Treatment Plan  Patient Details  Name: Kevin Caldwell. MRN: [id number] Date of Birth: 20-Jul-1945 Referring Provider:   Flowsheet Row INTENSIVE CARDIAC REHAB ORIENT from 02/14/2023 in [facility]  Referring Provider Kevin Passe, MD       Initial Encounter Date:  Flowsheet Row INTENSIVE CARDIAC REHAB ORIENT from 02/14/2023 in [facility]  Date 02/14/23       Visit Diagnosis: 01/28/23 S/P coronary artery stent placement  Patient's Home Medications on Admission:  Current Outpatient Medications:    amLODipine  (NORVASC ) 5 MG tablet, Take 5 mg by mouth daily. (Patient not taking: Reported on 03/11/2023), Disp: , Rfl:    apixaban  (ELIQUIS ) 5 MG TABS tablet, Take 1 tablet (5 mg total) by mouth 2 (two) times daily., Disp: 180 tablet, Rfl: 1   atorvastatin  (LIPITOR) 40 MG tablet, Take 1 tablet (40 mg total) by mouth daily., Disp: 90 tablet, Rfl: 1   cholecalciferol (VITAMIN D) 1000 units tablet, Take 1,000 Units by mouth daily., Disp: , Rfl:    clopidogrel  (PLAVIX ) 75 MG tablet, Take 1 tablet (75 mg total) by mouth daily with breakfast., Disp: 90 tablet, Rfl: 1   nitroGLYCERIN  (NITROSTAT ) 0.4 MG SL tablet, Place 1 tablet (0.4 mg total) under the tongue every 5 (five) minutes as needed for chest pain., Disp: 25 tablet, Rfl: PRN   valsartan -hydrochlorothiazide (DIOVAN -HCT) 160-12.5 MG tablet, Take 1 tablet by mouth daily., Disp: , Rfl:   Past Medical History: Past Medical History:  Diagnosis Date   DVT (deep venous thrombosis) (HCC)    Hypertension    Renal disorder     Tobacco Use: Social History   Tobacco Use  Smoking Status Never  Smokeless Tobacco Never    Labs: Review Flowsheet       Latest Ref Rng & Units 07/21/2008  Labs for ITP Cardiac and Pulmonary Rehab  Cholestrol 0 - 200 mg/dL 799   LDL (calc) 0 - 99 mg/dL 857   HDL-C >60.99  mg/dL 62.09   Trlycerides 0.0 - 149.0 mg/dL 00.9     Capillary Blood Glucose: No results found for: GLUCAP   Exercise Target Goals: Exercise Program Goal: Individual exercise prescription set using results from initial 6 min walk test and THRR while considering  patient's activity barriers and safety.   Exercise Prescription Goal: Initial exercise prescription builds to 30-45 minutes a day of aerobic activity, 2-3 days per week.  Home exercise guidelines will be given to patient during program as part of exercise prescription that the participant will acknowledge.  Activity Barriers & Risk Stratification:  Activity Barriers & Cardiac Risk Stratification - 02/14/23 1257       Activity Barriers & Cardiac Risk Stratification   Activity Barriers Balance Concerns;Deconditioning;Neck/Spine Problems    Cardiac Risk Stratification High             6 Minute Walk:  6 Minute Walk     Row Name 02/14/23 1229         6 Minute Walk   Phase Initial     Distance 1440 feet     Walk Time 6 minutes     # of Rest Breaks 0     MPH 2.73     METS 2.3     RPE 9     Perceived Dyspnea  0     VO2 Peak 2.3     Symptoms No  Resting HR 69 bpm     Resting BP 103/63     Resting Oxygen Saturation  99 %     Exercise Oxygen Saturation  during 6 min walk 98 %     Max Ex. HR 100 bpm     Max Ex. BP 119/72     2 Minute Post BP 104/70              Oxygen Initial Assessment:   Oxygen Re-Evaluation:   Oxygen Discharge (Final Oxygen Re-Evaluation):   Initial Exercise Prescription:  Initial Exercise Prescription - 02/14/23 1300       Date of Initial Exercise RX and Referring Provider   Date 02/14/23    Referring Provider Kevin Passe, MD    Expected Discharge Date 05/08/23      Bike   Level 2    Watts 25    Minutes 15    METs 2.3      Recumbant Elliptical   Level 2    RPM 50    Watts 25    Minutes 15    METs 2.3      Prescription Details   Frequency (times per  week) 3    Duration Progress to 30 minutes of continuous aerobic without signs/symptoms of physical distress      Intensity   THRR 40-80% of Max Heartrate 57-114    Ratings of Perceived Exertion 11-13    Perceived Dyspnea 0-4      Progression   Progression Continue progressive overload as per policy without signs/symptoms or physical distress.      Resistance Training   Training Prescription Yes    Weight 3 lbs    Reps 10-15             Perform Capillary Blood Glucose checks as needed.  Exercise Prescription Changes:   Exercise Prescription Changes     Row Name 02/18/23 1400 03/01/23 1600           Response to Exercise   Blood Pressure (Admit) 100/56 122/68      Blood Pressure (Exercise) 108/70 138/66      Blood Pressure (Exit) 102/60 104/58      Heart Rate (Admit) 61 bpm 59 bpm      Heart Rate (Exercise) 125 bpm 127 bpm      Heart Rate (Exit) 77 bpm 75 bpm      Rating of Perceived Exertion (Exercise) 11 11      Symptoms None None      Comments Pt's first day in the CRP2 program Reviewed METs      Duration Continue with 30 min of aerobic exercise without signs/symptoms of physical distress. Continue with 30 min of aerobic exercise without signs/symptoms of physical distress.      Intensity THRR unchanged THRR unchanged        Progression   Progression Continue to progress workloads to maintain intensity without signs/symptoms of physical distress. Continue to progress workloads to maintain intensity without signs/symptoms of physical distress.      Average METs 3 3.9        Resistance Training   Training Prescription Yes Yes      Weight 3 lbs 3 lbs      Reps 10-15 10-15      Time 10 Minutes 10 Minutes        Interval Training   Interval Training No No        Bike   Level 2 2  Watts 33 33      Minutes 15 15      METs 2.8 3        Recumbant Elliptical   Level 2 2      RPM 66 58      Watts 98 89      Minutes 15 15      METs 3.2 3                Exercise Comments:   Exercise Comments     Row Name 02/18/23 1446 03/01/23 1528         Exercise Comments Pt's first day in the CRP2 program. no complaints with today's session. Pt is concerned about his blood pressure being low normal. Pt is asymptoamtic. Explained to patient we will continue to monitor during exercise. Reviewed METs with patient today. No increases in workloads as pt exceeds THRR. Will request increase in THRR in this persists.               Exercise Goals and Review:   Exercise Goals     Row Name 02/14/23 1104             Exercise Goals   Increase Physical Activity Yes       Intervention Provide advice, education, support and counseling about physical activity/exercise needs.;Develop an individualized exercise prescription for aerobic and resistive training based on initial evaluation findings, risk stratification, comorbidities and participant's personal goals.       Expected Outcomes Long Term: Exercising regularly at least 3-5 days a week.;Short Term: Attend rehab on a regular basis to increase amount of physical activity.;Long Term: Add in home exercise to make exercise part of routine and to increase amount of physical activity.       Increase Strength and Stamina Yes       Intervention Provide advice, education, support and counseling about physical activity/exercise needs.;Develop an individualized exercise prescription for aerobic and resistive training based on initial evaluation findings, risk stratification, comorbidities and participant's personal goals.       Expected Outcomes Short Term: Increase workloads from initial exercise prescription for resistance, speed, and METs.;Short Term: Perform resistance training exercises routinely during rehab and add in resistance training at home;Long Term: Improve cardiorespiratory fitness, muscular endurance and strength as measured by increased METs and functional capacity ( )       Able to understand  and use rate of perceived exertion (RPE) scale Yes       Intervention Provide education and explanation on how to use RPE scale       Expected Outcomes Short Term: Able to use RPE daily in rehab to express subjective intensity level;Long Term:  Able to use RPE to guide intensity level when exercising independently       Knowledge and understanding of Target Heart Rate Range (THRR) Yes       Intervention Provide education and explanation of THRR including how the numbers were predicted and where they are located for reference       Expected Outcomes Short Term: Able to state/look up THRR;Short Term: Able to use daily as guideline for intensity in rehab;Long Term: Able to use THRR to govern intensity when exercising independently       Understanding of Exercise Prescription Yes       Intervention Provide education, explanation, and written materials on patient's individual exercise prescription       Expected Outcomes Short Term: Able to explain program exercise prescription;Long Term: Able to explain  home exercise prescription to exercise independently                Exercise Goals Re-Evaluation :  Exercise Goals Re-Evaluation     Row Name 02/18/23 1444             Exercise Goal Re-Evaluation   Exercise Goals Review Increase Physical Activity;Increase Strength and Stamina;Able to understand and use rate of perceived exertion (RPE) scale;Knowledge and understanding of Target Heart Rate Range (THRR);Understanding of Exercise Prescription       Comments Pt's first day in hte CRP2 program. Pt understnads the RPE scale, THRR and Exercise Rx.       Expected Outcomes Will continue to monitor the patient and progress exercise workloads as tolerated.                Discharge Exercise Prescription (Final Exercise Prescription Changes):  Exercise Prescription Changes - 03/01/23 1600       Response to Exercise   Blood Pressure (Admit) 122/68    Blood Pressure (Exercise) 138/66    Blood  Pressure (Exit) 104/58    Heart Rate (Admit) 59 bpm    Heart Rate (Exercise) 127 bpm    Heart Rate (Exit) 75 bpm    Rating of Perceived Exertion (Exercise) 11    Symptoms None    Comments Reviewed METs    Duration Continue with 30 min of aerobic exercise without signs/symptoms of physical distress.    Intensity THRR unchanged      Progression   Progression Continue to progress workloads to maintain intensity without signs/symptoms of physical distress.    Average METs 3.9      Resistance Training   Training Prescription Yes    Weight 3 lbs    Reps 10-15    Time 10 Minutes      Interval Training   Interval Training No      Bike   Level 2    Watts 33    Minutes 15    METs 3      Recumbant Elliptical   Level 2    RPM 58    Watts 89    Minutes 15    METs 3             Nutrition:  Target Goals: Understanding of nutrition guidelines, daily intake of sodium 1500mg , cholesterol 200mg , calories 30% from fat and 7% or less from saturated fats, daily to have 5 or more servings of fruits and vegetables.  Biometrics:  Pre Biometrics - 02/14/23 1115       Pre Biometrics   Waist Circumference 44.5 inches    Hip Circumference 47 inches    Waist to Hip Ratio 0.95 %    Triceps Skinfold 20 mm    % Body Fat 33.3 %    Grip Strength 40 kg    Flexibility 9 in    Single Leg Stand 5.18 seconds              Nutrition Therapy Plan and Nutrition Goals:   Nutrition Assessments:  MEDIFICTS Score Key: >=70 Need to make dietary changes  40-70 Heart Healthy Diet <= 40 Therapeutic Level Cholesterol Diet   Flowsheet Row INTENSIVE CARDIAC REHAB from 02/18/2023 in [facility]  Picture Your Plate Total Score on Admission 67      Picture Your Plate Scores: <59 Unhealthy dietary pattern with much room for improvement. 41-50 Dietary pattern unlikely to meet recommendations for good  health and room for  improvement. 51-60 More healthful dietary pattern, with some room for improvement.  >60 Healthy dietary pattern, although there may be some specific behaviors that could be improved.    Nutrition Goals Re-Evaluation:   Nutrition Goals Re-Evaluation:   Nutrition Goals Discharge (Final Nutrition Goals Re-Evaluation):   Psychosocial: Target Goals: Acknowledge presence or absence of significant depression and/or stress, maximize coping skills, provide positive support system. Participant is able to verbalize types and ability to use techniques and skills needed for reducing stress and depression.  Initial Review & Psychosocial Screening:  Initial Psych Review & Screening - 02/14/23 1301       Initial Review   Current issues with None Identified;Current Stress Concerns    Source of Stress Concerns Occupation    Comments Voices stress level is low      Family Dynamics   Good Support System? --   Pt has spouse, son and daughter for support     Barriers   Psychosocial barriers to participate in program There are no identifiable barriers or psychosocial needs.      Screening Interventions   Interventions Encouraged to exercise             Quality of Life Scores:  Quality of Life - 02/14/23 1304       Quality of Life   Select Quality of Life      Quality of Life Scores   Health/Function Pre 29.08 %    Socioeconomic Pre 30 %    Psych/Spiritual Pre 30 %    Family Pre 30 %    GLOBAL Pre 29.63 %            Scores of 19 and below usually indicate a poorer quality of life in these areas.  A difference of  2-3 points is a clinically meaningful difference.  A difference of 2-3 points in the total score of the Quality of Life Index has been associated with significant improvement in overall quality of life, self-image, physical symptoms, and general health in studies assessing change in quality of life.  PHQ-9: Review Flowsheet       02/14/2023  Depression screen PHQ  2/9  Decreased Interest 0  Down, Depressed, Hopeless 0  PHQ - 2 Score 0  Altered sleeping 0  Tired, decreased energy 0  Change in appetite 0  Feeling bad or failure about yourself  0  Trouble concentrating 0  Moving slowly or fidgety/restless 0  Suicidal thoughts 0  PHQ-9 Score 0  Difficult doing work/chores Not difficult at all   Interpretation of Total Score  Total Score Depression Severity:  1-4 = Minimal depression, 5-9 = Mild depression, 10-14 = Moderate depression, 15-19 = Moderately severe depression, 20-27 = Severe depression   Psychosocial Evaluation and Intervention:   Psychosocial Re-Evaluation:  Psychosocial Re-Evaluation     Row Name 02/19/23 0920 03/08/23 1107           Psychosocial Re-Evaluation   Current issues with None Identified None Identified      Comments Octaviano did not voice any increased concerns or stressors on his first day of exercise. --      Interventions Encouraged to attend Cardiac Rehabilitation for the exercise Encouraged to attend Cardiac Rehabilitation for the exercise      Continue Psychosocial Services  No Follow up required No Follow up required               Psychosocial Discharge (Final Psychosocial Re-Evaluation):  Psychosocial Re-Evaluation -  03/08/23 1107       Psychosocial Re-Evaluation   Current issues with None Identified    Interventions Encouraged to attend Cardiac Rehabilitation for the exercise    Continue Psychosocial Services  No Follow up required             Vocational Rehabilitation: Provide vocational rehab assistance to qualifying candidates.   Vocational Rehab Evaluation & Intervention:  Vocational Rehab - 02/14/23 1305       Initial Vocational Rehab Evaluation & Intervention   Assessment shows need for Vocational Rehabilitation No   Pt is currently working            Education: Education Goals: Education classes will be provided on a weekly basis, covering required topics. Participant will  state understanding/return demonstration of topics presented.    Education     Row Name 02/18/23 0900     Education   Cardiac Education Topics Pritikin   Select Workshops     Workshops   Educator Exercise Physiologist   Select Exercise   Exercise Workshop Exercise Basics: Building Your Action Plan   Instruction Review Code 1- Verbalizes Understanding   Class Start Time [phone]   Class Stop Time 0850   Class Time Calculation (min) 38 min    Row Name 02/20/23 1300     Education   Cardiac Education Topics Pritikin   Orthoptist   Educator Nurse   Weekly Topic One-Pot Wonders   Instruction Review Code 1- Verbalizes Understanding   Class Start Time [phone]   Class Stop Time 0849   Class Time Calculation (min) 33 min    Row Name 02/22/23 0800     Education   Cardiac Education Topics Pritikin   Psychologist, Forensic Exercise Education   Exercise Education Move It!   Instruction Review Code 1- Verbalizes Understanding   Class Start Time 0815   Class Stop Time 0855   Class Time Calculation (min) 40 min    Row Name 02/25/23 0800     Education   Cardiac Education Topics Pritikin   Psychologist, Forensic General Education   General Education Hypertension and Heart Disease   Instruction Review Code 1- Verbalizes Understanding   Class Start Time [phone]   Class Stop Time 0854   Class Time Calculation (min) 38 min    Row Name 03/01/23 0900     Education   Cardiac Education Topics Pritikin   Glass Blower/designer Nutrition   Nutrition Workshop Targeting Your Nutrition Priorities   Instruction Review Code 1- Verbalizes Understanding   Class Start Time 0815   Class Stop Time 0849   Class Time Calculation (min) 34 min    Row Name 03/04/23 0900     Education   Cardiac Education Topics  Pritikin   Select Workshops     Workshops   Educator Exercise Physiologist   Select Psychosocial   Psychosocial Workshop Focused Goals, Sustainable Changes   Instruction Review Code 1- Verbalizes Understanding   Class Start Time 0815   Class Stop Time 0848   Class Time Calculation (min) 33 min    Row Name 03/08/23 1000     Education   Cardiac Education Topics Pritikin   Psychologist, Sport And Exercise  Core Videos   Educator Dietitian   Select Nutrition   Nutrition Dining Out - Part 1   Instruction Review Code 1- Verbalizes Understanding   Class Start Time [phone]   Class Stop Time 0906   Class Time Calculation (min) 49 min    Row Name 03/11/23 0800     Education   Cardiac Education Topics Pritikin   Select Core Videos     Core Videos   Educator Exercise Physiologist   Select Exercise Education   Exercise Education Biomechanial Limitations   Instruction Review Code 1- Verbalizes Understanding   Class Start Time 0815   Class Stop Time 0848   Class Time Calculation (min) 33 min            Core Videos: Exercise    Move It!  Clinical staff conducted group or individual video education with verbal and written material and guidebook.  Patient learns the recommended Pritikin exercise program. Exercise with the goal of living a long, healthy life. Some of the health benefits of exercise include controlled diabetes, healthier blood pressure levels, improved cholesterol levels, improved heart and lung capacity, improved sleep, and better body composition. Everyone should speak with their doctor before starting or changing an exercise routine.  Biomechanical Limitations Clinical staff conducted group or individual video education with verbal and written material and guidebook.  Patient learns how biomechanical limitations can impact exercise and how we can mitigate and possibly overcome limitations to have an impactful and balanced exercise routine.  Body Composition Clinical staff  conducted group or individual video education with verbal and written material and guidebook.  Patient learns that body composition (ratio of muscle mass to fat mass) is a key component to assessing overall fitness, rather than body weight alone. Increased fat mass, especially visceral belly fat, can put us  at increased risk for metabolic syndrome, type 2 diabetes, heart disease, and even death. It is recommended to combine diet and exercise (cardiovascular and resistance training) to improve your body composition. Seek guidance from your physician and exercise physiologist before implementing an exercise routine.  Exercise Action Plan Clinical staff conducted group or individual video education with verbal and written material and guidebook.  Patient learns the recommended strategies to achieve and enjoy long-term exercise adherence, including variety, self-motivation, self-efficacy, and positive decision making. Benefits of exercise include fitness, good health, weight management, more energy, better sleep, less stress, and overall well-being.  Medical   Heart Disease Risk Reduction Clinical staff conducted group or individual video education with verbal and written material and guidebook.  Patient learns our heart is our most vital organ as it circulates oxygen, nutrients, white blood cells, and hormones throughout the entire body, and carries waste away. Data supports a plant-based eating plan like the Pritikin Program for its effectiveness in slowing progression of and reversing heart disease. The video provides a number of recommendations to address heart disease.   Metabolic Syndrome and Belly Fat  Clinical staff conducted group or individual video education with verbal and written material and guidebook.  Patient learns what metabolic syndrome is, how it leads to heart disease, and how one can reverse it and keep it from coming back. You have metabolic syndrome if you have 3 of the following 5  criteria: abdominal obesity, high blood pressure, high triglycerides, low HDL cholesterol, and high blood sugar.  Hypertension and Heart Disease Clinical staff conducted group or individual video education with verbal and written material and guidebook.  Patient learns that high blood pressure,  or hypertension, is very common in the [place] . Hypertension is largely due to excessive salt intake, but other important risk factors include being overweight, physical inactivity, drinking too much alcohol, smoking, and not eating enough potassium from fruits and vegetables. High blood pressure is a leading risk factor for heart attack, stroke, congestive heart failure, dementia, kidney failure, and premature death. Long-term effects of excessive salt intake include stiffening of the arteries and thickening of heart muscle and organ damage. Recommendations include ways to reduce hypertension and the risk of heart disease.  Diseases of Our Time - Focusing on Diabetes Clinical staff conducted group or individual video education with verbal and written material and guidebook.  Patient learns why the best way to stop diseases of our time is prevention, through food and other lifestyle changes. Medicine (such as prescription pills and surgeries) is often only a Band-Aid on the problem, not a long-term solution. Most common diseases of our time include obesity, type 2 diabetes, hypertension, heart disease, and cancer. The Pritikin Program is recommended and has been proven to help reduce, reverse, and/or prevent the damaging effects of metabolic syndrome.  Nutrition   Overview of the Pritikin Eating Plan  Clinical staff conducted group or individual video education with verbal and written material and guidebook.  Patient learns about the Pritikin Eating Plan for disease risk reduction. The Pritikin Eating Plan emphasizes a wide variety of unrefined, minimally-processed carbohydrates, like fruits, vegetables,  whole grains, and legumes. Go, Caution, and Stop food choices are explained. Plant-based and lean animal proteins are emphasized. Rationale provided for low sodium intake for blood pressure control, low added sugars for blood sugar stabilization, and low added fats and oils for coronary artery disease risk reduction and weight management.  Calorie Density  Clinical staff conducted group or individual video education with verbal and written material and guidebook.  Patient learns about calorie density and how it impacts the Pritikin Eating Plan. Knowing the characteristics of the food you choose will help you decide whether those foods will lead to weight gain or weight loss, and whether you want to consume more or less of them. Weight loss is usually a side effect of the Pritikin Eating Plan because of its focus on low calorie-dense foods.  Label Reading  Clinical staff conducted group or individual video education with verbal and written material and guidebook.  Patient learns about the Pritikin recommended label reading guidelines and corresponding recommendations regarding calorie density, added sugars, sodium content, and whole grains.  Dining Out - Part 1  Clinical staff conducted group or individual video education with verbal and written material and guidebook.  Patient learns that restaurant meals can be sabotaging because they can be so high in calories, fat, sodium, and/or sugar. Patient learns recommended strategies on how to positively address this and avoid unhealthy pitfalls.  Facts on Fats  Clinical staff conducted group or individual video education with verbal and written material and guidebook.  Patient learns that lifestyle modifications can be just as effective, if not more so, as many medications for lowering your risk of heart disease. A Pritikin lifestyle can help to reduce your risk of inflammation and atherosclerosis (cholesterol build-up, or plaque, in the artery walls).  Lifestyle interventions such as dietary choices and physical activity address the cause of atherosclerosis. A review of the types of fats and their impact on blood cholesterol levels, along with dietary recommendations to reduce fat intake is also included.  Nutrition Action Plan  Clinical staff conducted group  or individual video education with verbal and written material and guidebook.  Patient learns how to incorporate Pritikin recommendations into their lifestyle. Recommendations include planning and keeping personal health goals in mind as an important part of their success.  Healthy Mind-Set    Healthy Minds, Bodies, Hearts  Clinical staff conducted group or individual video education with verbal and written material and guidebook.  Patient learns how to identify when they are stressed. Video will discuss the impact of that stress, as well as the many benefits of stress management. Patient will also be introduced to stress management techniques. The way we think, act, and feel has an impact on our hearts.  How Our Thoughts Can Heal Our Hearts  Clinical staff conducted group or individual video education with verbal and written material and guidebook.  Patient learns that negative thoughts can cause depression and anxiety. This can result in negative lifestyle behavior and serious health problems. Cognitive behavioral therapy is an effective method to help control our thoughts in order to change and improve our emotional outlook.  Additional Videos:  Exercise    Improving Performance  Clinical staff conducted group or individual video education with verbal and written material and guidebook.  Patient learns to use a non-linear approach by alternating intensity levels and lengths of time spent exercising to help burn more calories and lose more body fat. Cardiovascular exercise helps improve heart health, metabolism, hormonal balance, blood sugar control, and recovery from fatigue. Resistance  training improves strength, endurance, balance, coordination, reaction time, metabolism, and muscle mass. Flexibility exercise improves circulation, posture, and balance. Seek guidance from your physician and exercise physiologist before implementing an exercise routine and learn your capabilities and proper form for all exercise.  Introduction to Yoga  Clinical staff conducted group or individual video education with verbal and written material and guidebook.  Patient learns about yoga, a discipline of the coming together of mind, breath, and body. The benefits of yoga include improved flexibility, improved range of motion, better posture and core strength, increased lung function, weight loss, and positive self-image. Yoga's heart health benefits include lowered blood pressure, healthier heart rate, decreased cholesterol and triglyceride levels, improved immune function, and reduced stress. Seek guidance from your physician and exercise physiologist before implementing an exercise routine and learn your capabilities and proper form for all exercise.  Medical   Aging: Enhancing Your Quality of Life  Clinical staff conducted group or individual video education with verbal and written material and guidebook.  Patient learns key strategies and recommendations to stay in good physical health and enhance quality of life, such as prevention strategies, having an advocate, securing a Health Care Proxy and Power of Attorney, and keeping a list of medications and system for tracking them. It also discusses how to avoid risk for bone loss.  Biology of Weight Control  Clinical staff conducted group or individual video education with verbal and written material and guidebook.  Patient learns that weight gain occurs because we consume more calories than we burn (eating more, moving less). Even if your body weight is normal, you may have higher ratios of fat compared to muscle mass. Too much body fat puts you at  increased risk for cardiovascular disease, heart attack, stroke, type 2 diabetes, and obesity-related cancers. In addition to exercise, following the Pritikin Eating Plan can help reduce your risk.  Decoding Lab Results  Clinical staff conducted group or individual video education with verbal and written material and guidebook.  Patient learns that lab  test reflects one measurement whose values change over time and are influenced by many factors, including medication, stress, sleep, exercise, food, hydration, pre-existing medical conditions, and more. It is recommended to use the knowledge from this video to become more involved with your lab results and evaluate your numbers to speak with your doctor.   Diseases of Our Time - Overview  Clinical staff conducted group or individual video education with verbal and written material and guidebook.  Patient learns that according to the CDC, 50% to 70% of chronic diseases (such as obesity, type 2 diabetes, elevated lipids, hypertension, and heart disease) are avoidable through lifestyle improvements including healthier food choices, listening to satiety cues, and increased physical activity.  Sleep Disorders Clinical staff conducted group or individual video education with verbal and written material and guidebook.  Patient learns how good quality and duration of sleep are important to overall health and well-being. Patient also learns about sleep disorders and how they impact health along with recommendations to address them, including discussing with a physician.  Nutrition  Dining Out - Part 2 Clinical staff conducted group or individual video education with verbal and written material and guidebook.  Patient learns how to plan ahead and communicate in order to maximize their dining experience in a healthy and nutritious manner. Included are recommended food choices based on the type of restaurant the patient is visiting.   Fueling a Fish Farm Manager conducted group or individual video education with verbal and written material and guidebook.  There is a strong connection between our food choices and our health. Diseases like obesity and type 2 diabetes are very prevalent and are in large-part due to lifestyle choices. The Pritikin Eating Plan provides plenty of food and hunger-curbing satisfaction. It is easy to follow, affordable, and helps reduce health risks.  Menu Workshop  Clinical staff conducted group or individual video education with verbal and written material and guidebook.  Patient learns that restaurant meals can sabotage health goals because they are often packed with calories, fat, sodium, and sugar. Recommendations include strategies to plan ahead and to communicate with the manager, chef, or server to help order a healthier meal.  Planning Your Eating Strategy  Clinical staff conducted group or individual video education with verbal and written material and guidebook.  Patient learns about the Pritikin Eating Plan and its benefit of reducing the risk of disease. The Pritikin Eating Plan does not focus on calories. Instead, it emphasizes high-quality, nutrient-rich foods. By knowing the characteristics of the foods, we choose, we can determine their calorie density and make informed decisions.  Targeting Your Nutrition Priorities  Clinical staff conducted group or individual video education with verbal and written material and guidebook.  Patient learns that lifestyle habits have a tremendous impact on disease risk and progression. This video provides eating and physical activity recommendations based on your personal health goals, such as reducing LDL cholesterol, losing weight, preventing or controlling type 2 diabetes, and reducing high blood pressure.  Vitamins and Minerals  Clinical staff conducted group or individual video education with verbal and written material and guidebook.  Patient learns different  ways to obtain key vitamins and minerals, including through a recommended healthy diet. It is important to discuss all supplements you take with your doctor.   Healthy Mind-Set    Smoking Cessation  Clinical staff conducted group or individual video education with verbal and written material and guidebook.  Patient learns that cigarette smoking and tobacco addiction pose  a serious health risk which affects millions of people. Stopping smoking will significantly reduce the risk of heart disease, lung disease, and many forms of cancer. Recommended strategies for quitting are covered, including working with your doctor to develop a successful plan.  Culinary   Becoming a Set Designer conducted group or individual video education with verbal and written material and guidebook.  Patient learns that cooking at home can be healthy, cost-effective, quick, and puts them in control. Keys to cooking healthy recipes will include looking at your recipe, assessing your equipment needs, planning ahead, making it simple, choosing cost-effective seasonal ingredients, and limiting the use of added fats, salts, and sugars.  Cooking - Breakfast and Snacks  Clinical staff conducted group or individual video education with verbal and written material and guidebook.  Patient learns how important breakfast is to satiety and nutrition through the entire day. Recommendations include key foods to eat during breakfast to help stabilize blood sugar levels and to prevent overeating at meals later in the day. Planning ahead is also a key component.  Cooking - Educational Psychologist conducted group or individual video education with verbal and written material and guidebook.  Patient learns eating strategies to improve overall health, including an approach to cook more at home. Recommendations include thinking of animal protein as a side on your plate rather than center stage and focusing instead on  lower calorie dense options like vegetables, fruits, whole grains, and plant-based proteins, such as beans. Making sauces in large quantities to freeze for later and leaving the skin on your vegetables are also recommended to maximize your experience.  Cooking - Healthy Salads and Dressing Clinical staff conducted group or individual video education with verbal and written material and guidebook.  Patient learns that vegetables, fruits, whole grains, and legumes are the foundations of the Pritikin Eating Plan. Recommendations include how to incorporate each of these in flavorful and healthy salads, and how to create homemade salad dressings. Proper handling of ingredients is also covered. Cooking - Soups and [employer] - Soups and Desserts Clinical staff conducted group or individual video education with verbal and written material and guidebook.  Patient learns that Pritikin soups and desserts make for easy, nutritious, and delicious snacks and meal components that are low in sodium, fat, sugar, and calorie density, while high in vitamins, minerals, and filling fiber. Recommendations include simple and healthy ideas for soups and desserts.   Overview     The Pritikin Solution Program Overview Clinical staff conducted group or individual video education with verbal and written material and guidebook.  Patient learns that the results of the Pritikin Program have been documented in more than 100 articles published in peer-reviewed journals, and the benefits include reducing risk factors for (and, in some cases, even reversing) high cholesterol, high blood pressure, type 2 diabetes, obesity, and more! An overview of the three key pillars of the Pritikin Program will be covered: eating well, doing regular exercise, and having a healthy mind-set.  WORKSHOPS  Exercise: Exercise Basics: Building Your Action Plan Clinical staff led group instruction and group discussion with PowerPoint presentation  and patient guidebook. To enhance the learning environment the use of posters, models and videos may be added. At the conclusion of this workshop, patients will comprehend the difference between physical activity and exercise, as well as the benefits of incorporating both, into their routine. Patients will understand the FITT (Frequency, Intensity, Time, and Type) principle and how  to use it to build an exercise action plan. In addition, safety concerns and other considerations for exercise and cardiac rehab will be addressed by the presenter. The purpose of this lesson is to promote a comprehensive and effective weekly exercise routine in order to improve patients' overall level of fitness.   Managing Heart Disease: Your Path to a Healthier Heart Clinical staff led group instruction and group discussion with PowerPoint presentation and patient guidebook. To enhance the learning environment the use of posters, models and videos may be added.At the conclusion of this workshop, patients will understand the anatomy and physiology of the heart. Additionally, they will understand how Pritikin's three pillars impact the risk factors, the progression, and the management of heart disease.  The purpose of this lesson is to provide a high-level overview of the heart, heart disease, and how the Pritikin lifestyle positively impacts risk factors.  Exercise Biomechanics Clinical staff led group instruction and group discussion with PowerPoint presentation and patient guidebook. To enhance the learning environment the use of posters, models and videos may be added. Patients will learn how the structural parts of their bodies function and how these functions impact their daily activities, movement, and exercise. Patients will learn how to promote a neutral spine, learn how to manage pain, and identify ways to improve their physical movement in order to promote healthy living. The purpose of this lesson is to  expose patients to common physical limitations that impact physical activity. Participants will learn practical ways to adapt and manage aches and pains, and to minimize their effect on regular exercise. Patients will learn how to maintain good posture while sitting, walking, and lifting.  Balance Training and Fall Prevention  Clinical staff led group instruction and group discussion with PowerPoint presentation and patient guidebook. To enhance the learning environment the use of posters, models and videos may be added. At the conclusion of this workshop, patients will understand the importance of their sensorimotor skills (vision, proprioception, and the vestibular system) in maintaining their ability to balance as they age. Patients will apply a variety of balancing exercises that are appropriate for their current level of function. Patients will understand the common causes for poor balance, possible solutions to these problems, and ways to modify their physical environment in order to minimize their fall risk. The purpose of this lesson is to teach patients about the importance of maintaining balance as they age and ways to minimize their risk of falling.  WORKSHOPS   Nutrition:  Fueling a Ship Broker led group instruction and group discussion with PowerPoint presentation and patient guidebook. To enhance the learning environment the use of posters, models and videos may be added. Patients will review the foundational principles of the Pritikin Eating Plan and understand what constitutes a serving size in each of the food groups. Patients will also learn Pritikin-friendly foods that are better choices when away from home and review make-ahead meal and snack options. Calorie density will be reviewed and applied to three nutrition priorities: weight maintenance, weight loss, and weight gain. The purpose of this lesson is to reinforce (in a group setting) the key concepts around  what patients are recommended to eat and how to apply these guidelines when away from home by planning and selecting Pritikin-friendly options. Patients will understand how calorie density may be adjusted for different weight management goals.  Mindful Eating  Clinical staff led group instruction and group discussion with PowerPoint presentation and patient guidebook. To enhance the learning environment  the use of posters, models and videos may be added. Patients will briefly review the concepts of the Pritikin Eating Plan and the importance of low-calorie dense foods. The concept of mindful eating will be introduced as well as the importance of paying attention to internal hunger signals. Triggers for non-hunger eating and techniques for dealing with triggers will be explored. The purpose of this lesson is to provide patients with the opportunity to review the basic principles of the Pritikin Eating Plan, discuss the value of eating mindfully and how to measure internal cues of hunger and fullness using the Hunger Scale. Patients will also discuss reasons for non-hunger eating and learn strategies to use for controlling emotional eating.  Targeting Your Nutrition Priorities Clinical staff led group instruction and group discussion with PowerPoint presentation and patient guidebook. To enhance the learning environment the use of posters, models and videos may be added. Patients will learn how to determine their genetic susceptibility to disease by reviewing their family history. Patients will gain insight into the importance of diet as part of an overall healthy lifestyle in mitigating the impact of genetics and other environmental insults. The purpose of this lesson is to provide patients with the opportunity to assess their personal nutrition priorities by looking at their family history, their own health history and current risk factors. Patients will also be able to discuss ways of prioritizing and  modifying the Pritikin Eating Plan for their highest risk areas  Menu  Clinical staff led group instruction and group discussion with PowerPoint presentation and patient guidebook. To enhance the learning environment the use of posters, models and videos may be added. Using menus brought in from [employer], or printed from [employer], patients will apply the Pritikin dining out guidelines that were presented in the [employer] video. Patients will also be able to practice these guidelines in a variety of provided scenarios. The purpose of this lesson is to provide patients with the opportunity to practice hands-on learning of the Pritikin Dining Out guidelines with actual menus and practice scenarios.  Label Reading Clinical staff led group instruction and group discussion with PowerPoint presentation and patient guidebook. To enhance the learning environment the use of posters, models and videos may be added. Patients will review and discuss the Pritikin label reading guidelines presented in Pritikin's Label Reading Educational series video. Using fool labels brought in from local grocery stores and markets, patients will apply the label reading guidelines and determine if the packaged food meet the Pritikin guidelines. The purpose of this lesson is to provide patients with the opportunity to review, discuss, and practice hands-on learning of the Pritikin Label Reading guidelines with actual packaged food labels. Cooking School  Pritikin's [employer] are designed to teach patients ways to prepare quick, simple, and affordable recipes at home. The importance of nutrition's role in chronic disease risk reduction is reflected in its emphasis in the overall Pritikin program. By learning how to prepare essential core Pritikin Eating Plan recipes, patients will increase control over what they eat; be able to customize the flavor of foods without the use of added salt,  sugar, or fat; and improve the quality of the food they consume. By learning a set of core recipes which are easily assembled, quickly prepared, and affordable, patients are more likely to prepare more healthy foods at home. These workshops focus on convenient breakfasts, simple entres, side dishes, and desserts which can be prepared with minimal effort and are consistent with  nutrition recommendations for cardiovascular risk reduction. Cooking [employer] are taught by a armed forces logistics/support/administrative officer (RD) who has been trained by the [employer]. The chef or RD has a clear understanding of the importance of minimizing - if not completely eliminating - added fat, sugar, and sodium in recipes. Throughout the series of Cooking School Workshop sessions, patients will learn about healthy ingredients and efficient methods of cooking to build confidence in their capability to prepare    Cooking School weekly topics:  Adding Flavor- Sodium-Free  Fast and Healthy Breakfasts  Powerhouse Plant-Based Proteins  Satisfying Salads and Dressings  Simple Sides and Sauces  International Cuisine-Spotlight on the [employer] Zones  Delicious Desserts  Savory Soups  [employer] - Meals in a Astronomer Appetizers and Snacks  Comforting Weekend Breakfasts  One-Pot Wonders   Fast Evening Meals  Landscape Architect Your Pritikin Plate  WORKSHOPS   Healthy Mindset (Psychosocial):  Focused Goals, Sustainable Changes Clinical staff led group instruction and group discussion with PowerPoint presentation and patient guidebook. To enhance the learning environment the use of posters, models and videos may be added. Patients will be able to apply effective goal setting strategies to establish at least one personal goal, and then take consistent, meaningful action toward that goal. They will learn to identify common barriers to achieving personal goals and develop strategies to overcome  them. Patients will also gain an understanding of how our mind-set can impact our ability to achieve goals and the importance of cultivating a positive and growth-oriented mind-set. The purpose of this lesson is to provide patients with a deeper understanding of how to set and achieve personal goals, as well as the tools and strategies needed to overcome common obstacles which may arise along the way.  From Head to Heart: The Power of a Healthy Outlook  Clinical staff led group instruction and group discussion with PowerPoint presentation and patient guidebook. To enhance the learning environment the use of posters, models and videos may be added. Patients will be able to recognize and describe the impact of emotions and mood on physical health. They will discover the importance of self-care and explore self-care practices which may work for them. Patients will also learn how to utilize the 4 C's to cultivate a healthier outlook and better manage stress and challenges. The purpose of this lesson is to demonstrate to patients how a healthy outlook is an essential part of maintaining good health, especially as they continue their cardiac rehab journey.  Healthy Sleep for a Healthy Heart Clinical staff led group instruction and group discussion with PowerPoint presentation and patient guidebook. To enhance the learning environment the use of posters, models and videos may be added. At the conclusion of this workshop, patients will be able to demonstrate knowledge of the importance of sleep to overall health, well-being, and quality of life. They will understand the symptoms of, and treatments for, common sleep disorders. Patients will also be able to identify daytime and nighttime behaviors which impact sleep, and they will be able to apply these tools to help manage sleep-related challenges. The purpose of this lesson is to provide patients with a general overview of sleep and outline the importance of quality  sleep. Patients will learn about a few of the most common sleep disorders. Patients will also be introduced to the concept of "sleep hygiene," and discover ways to self-manage certain sleeping problems through simple daily behavior changes. Finally, the workshop will motivate patients  by clarifying the links between quality sleep and their goals of heart-healthy living.   Recognizing and Reducing Stress Clinical staff led group instruction and group discussion with PowerPoint presentation and patient guidebook. To enhance the learning environment the use of posters, models and videos may be added. At the conclusion of this workshop, patients will be able to understand the types of stress reactions, differentiate between acute and chronic stress, and recognize the impact that chronic stress has on their health. They will also be able to apply different coping mechanisms, such as reframing negative self-talk. Patients will have the opportunity to practice a variety of stress management techniques, such as deep abdominal breathing, progressive muscle relaxation, and/or guided imagery.  The purpose of this lesson is to educate patients on the role of stress in their lives and to provide healthy techniques for coping with it.  Learning Barriers/Preferences:  Learning Barriers/Preferences - 02/14/23 1304       Learning Barriers/Preferences   Learning Barriers Sight;Hearing   wears glasses and hearing aids   Learning Preferences Audio;Verbal Instruction;Computer/Internet;Video;Written Material;Group Instruction;Individual Instruction;Pictoral;Skilled Demonstration             Education Topics:  Knowledge Questionnaire Score:  Knowledge Questionnaire Score - 02/14/23 1305       Knowledge Questionnaire Score   Pre Score 19/24             Core Components/Risk Factors/Patient Goals at Admission:  Personal Goals and Risk Factors at Admission - 02/14/23 1305       Core Components/Risk  Factors/Patient Goals on Admission    Weight Management Yes;Obesity;Weight Loss    Intervention Weight Management: Develop a combined nutrition and exercise program designed to reach desired caloric intake, while maintaining appropriate intake of nutrient and fiber, sodium and fats, and appropriate energy expenditure required for the weight goal.;Weight Management: Provide education and appropriate resources to help participant work on and attain dietary goals.;Weight Management/Obesity: Establish reasonable short term and long term weight goals.;Obesity: Provide education and appropriate resources to help participant work on and attain dietary goals.    Admit Weight 243 lb 9.7 oz (110.5 kg)    Expected Outcomes Short Term: Continue to assess and modify interventions until short term weight is achieved;Long Term: Adherence to nutrition and physical activity/exercise program aimed toward attainment of established weight goal;Weight Loss: Understanding of general recommendations for a balanced deficit meal plan, which promotes 1-2 lb weight loss per week and includes a negative energy balance of [phone] kcal/d;Understanding recommendations for meals to include 15-35% energy as protein, 25-35% energy from fat, 35-60% energy from carbohydrates, less than 200mg  of dietary cholesterol, 20-35 gm of total fiber daily;Understanding of distribution of calorie intake throughout the day with the consumption of 4-5 meals/snacks    Hypertension Yes    Intervention Provide education on lifestyle modifcations including regular physical activity/exercise, weight management, moderate sodium restriction and increased consumption of fresh fruit, vegetables, and low fat dairy, alcohol moderation, and smoking cessation.;Monitor prescription use compliance.    Expected Outcomes Short Term: Continued assessment and intervention until BP is < 140/107mm HG in hypertensive participants. < 130/50mm HG in hypertensive participants with  diabetes, heart failure or chronic kidney disease.;Long Term: Maintenance of blood pressure at goal levels.    Lipids Yes    Intervention Provide education and support for participant on nutrition & aerobic/resistive exercise along with prescribed medications to achieve LDL 70mg , HDL >40mg .    Expected Outcomes Short Term: Participant states understanding of desired cholesterol values and is  compliant with medications prescribed. Participant is following exercise prescription and nutrition guidelines.;Long Term: Cholesterol controlled with medications as prescribed, with individualized exercise RX and with personalized nutrition plan. Value goals: LDL < 70mg , HDL > 40 mg.    Stress Yes    Intervention Offer individual and/or small group education and counseling on adjustment to heart disease, stress management and health-related lifestyle change. Teach and support self-help strategies.    Expected Outcomes Short Term: Participant demonstrates changes in health-related behavior, relaxation and other stress management skills, ability to obtain effective social support, and compliance with psychotropic medications if prescribed.;Long Term: Emotional wellbeing is indicated by absence of clinically significant psychosocial distress or social isolation.             Core Components/Risk Factors/Patient Goals Review:   Goals and Risk Factor Review     Row Name 02/19/23 9078 03/08/23 1109           Core Components/Risk Factors/Patient Goals Review   Personal Goals Review Weight Management/Obesity;Lipids;Hypertension;Stress Weight Management/Obesity;Lipids;Hypertension;Stress      Review Octaviano started cardiac rehab on 02/18/23. Octaviano did well with exercise. Systolic BP's were in the lower 100's will continue to monitor BP's, asymptomatic. Octaviano has been doing well with exercise at cardiac rehab. Vital signs have been stable. Reported feeling lightheaded at home on 03/07/23. Orthostatic BP's were  checked.Encouraged Mr [place] to drink at least 40-60 ounces of water daily and not to fast prior to exercise at cardiac rehab. Onsite provider notifed about symptoms. Octaviano has lost 0.8 kg since starting cardiac rehab.      Expected Outcomes Octaviano will conitnue to participate in cardiac rehab for exercise, nutriton and lifestyle modificaitons Octaviano will conitnue to participate in cardiac rehab for exercise, nutriton and lifestyle modificaitons               Core Components/Risk Factors/Patient Goals at Discharge (Final Review):   Goals and Risk Factor Review - 03/08/23 1109       Core Components/Risk Factors/Patient Goals Review   Personal Goals Review Weight Management/Obesity;Lipids;Hypertension;Stress    Review Octaviano has been doing well with exercise at cardiac rehab. Vital signs have been stable. Reported feeling lightheaded at home on 03/07/23. Orthostatic BP's were checked.Encouraged Mr Magro to drink at least 40-60 ounces of water daily and not to fast prior to exercise at cardiac rehab. Onsite provider notifed about symptoms. Octaviano has lost 0.8 kg since starting cardiac rehab.    Expected Outcomes Octaviano will conitnue to participate in cardiac rehab for exercise, nutriton and lifestyle modificaitons             ITP Comments:  ITP Comments     Row Name 02/14/23 1103 02/19/23 0919 03/08/23 1106       ITP Comments Dr. Wilbert Bihari medical director. Introduction to [facility]ik[facility] education/intensive cardiac rehab. Initial orientation packet reviewed with patient. 30 Day ITP Review, Octaviano started cardiac rehab on 02/18/23.Octaviano did well with exercise. 30 Day ITP Review, Octaviano has good attendance and participation in cardiac rehab .              Comments: See ITP comments. Amlodipine  has been placed on hold due to orthostatic hypotension per Rosaline Bane NP.[name] RN BSN

## 2023-03-11 ENCOUNTER — Encounter (HOSPITAL_COMMUNITY)
Admission: RE | Admit: 2023-03-11 | Discharge: 2023-03-11 | Disposition: A | Discharge status: Home / Self Care | Payer: Medicare Other | Source: Ambulatory Visit | Attending: Cardiovascular Disease | Admitting: Cardiovascular Disease

## 2023-03-11 DIAGNOSIS — Z955 Presence of coronary angioplasty implant and graft: Secondary | ICD-10-CM

## 2023-03-11 NOTE — Progress Notes (Signed)
 Patient reported to [facility] exercise physiologist that he became very dizzy at home yesterday after sitting on the sofa and standing. Octaviano said that he fell back hitting the door frame and his back.  Will check orthostatic BP's. Octaviano reported that he hydrated and drank water today and ate some cottage cheese. Assessed the patient's right side. No bruising present. No pain upon palpation. Medications reviewed. Unchanged.  Orthostatic BP's are as follows. Lying BP 124/60 heart rate 57 Sitting BP 108/64 heart rate 60 Standing BP 92/60 heart rate 56  Patient denies any symptoms upon position change. Patient was given water to drink.  Will review with onsite provider Rosaline Bane NP.  Discussed with Rosaline Bane NP  thank you. would recommend that he hold his amlodipine  for now. recommend that he monitor home BP and record those. if he is asymptomatic today, I think he is safe to exercise  13 mins okay thank you I will let him know and proceed with exercise. I will let him know to keep a BP log at home thank you  11 mins MS Rosaline CHRISTELLA Bane, NP thank you  11 mins I think I will have him return to exercise on Wednesday and let him have a day to get the amlodipine  out of his system   Patient advised to hold amlodipine  until further notice and to keep blood pressure log daily. Octaviano did not take his amlodipine  yet for the day. Octaviano was advised not to exercise today. Will return to exercise on Wednesday. Patient left cardiac rehab without symptoms or complaints. [name] RN BSN

## 2023-03-13 ENCOUNTER — Encounter (HOSPITAL_COMMUNITY)
Admission: RE | Admit: 2023-03-13 | Discharge: 2023-03-13 | Disposition: A | Discharge status: Home / Self Care | Payer: Medicare Other | Source: Ambulatory Visit | Attending: Cardiovascular Disease | Admitting: Cardiovascular Disease

## 2023-03-13 DIAGNOSIS — Z955 Presence of coronary angioplasty implant and graft: Secondary | ICD-10-CM | POA: Diagnosis not present

## 2023-03-15 ENCOUNTER — Encounter (HOSPITAL_COMMUNITY)
Admission: RE | Admit: 2023-03-15 | Discharge: 2023-03-15 | Disposition: A | Discharge status: Home / Self Care | Payer: Medicare Other | Source: Ambulatory Visit | Attending: Cardiovascular Disease | Admitting: Cardiovascular Disease

## 2023-03-15 DIAGNOSIS — Z955 Presence of coronary angioplasty implant and graft: Secondary | ICD-10-CM | POA: Diagnosis not present

## 2023-03-18 ENCOUNTER — Encounter (HOSPITAL_COMMUNITY)
Admission: RE | Admit: 2023-03-18 | Discharge: 2023-03-18 | Disposition: A | Discharge status: Home / Self Care | Payer: Medicare Other | Source: Ambulatory Visit | Attending: Cardiovascular Disease | Admitting: Cardiovascular Disease

## 2023-03-18 DIAGNOSIS — Z955 Presence of coronary angioplasty implant and graft: Secondary | ICD-10-CM | POA: Diagnosis not present

## 2023-03-20 ENCOUNTER — Encounter (HOSPITAL_COMMUNITY)
Admission: RE | Admit: 2023-03-20 | Discharge: 2023-03-20 | Disposition: A | Discharge status: Home / Self Care | Payer: Medicare Other | Source: Ambulatory Visit | Attending: Cardiovascular Disease | Admitting: Cardiovascular Disease

## 2023-03-20 DIAGNOSIS — Z955 Presence of coronary angioplasty implant and graft: Secondary | ICD-10-CM

## 2023-03-22 ENCOUNTER — Encounter (HOSPITAL_COMMUNITY)
Admission: RE | Admit: 2023-03-22 | Discharge: 2023-03-22 | Disposition: A | Discharge status: Home / Self Care | Payer: Medicare Other | Source: Ambulatory Visit | Attending: Cardiovascular Disease | Admitting: Cardiovascular Disease

## 2023-03-22 DIAGNOSIS — Z955 Presence of coronary angioplasty implant and graft: Secondary | ICD-10-CM | POA: Diagnosis not present

## 2023-03-25 ENCOUNTER — Encounter (HOSPITAL_COMMUNITY)
Admission: RE | Admit: 2023-03-25 | Discharge: 2023-03-25 | Disposition: A | Discharge status: Home / Self Care | Payer: Medicare Other | Source: Ambulatory Visit | Attending: Cardiovascular Disease | Admitting: Cardiovascular Disease

## 2023-03-25 DIAGNOSIS — Z955 Presence of coronary angioplasty implant and graft: Secondary | ICD-10-CM | POA: Diagnosis not present

## 2023-03-25 DIAGNOSIS — B351 Tinea unguium: Secondary | ICD-10-CM | POA: Diagnosis not present

## 2023-03-25 DIAGNOSIS — Z85828 Personal history of other malignant neoplasm of skin: Secondary | ICD-10-CM | POA: Diagnosis not present

## 2023-03-27 ENCOUNTER — Encounter (HOSPITAL_COMMUNITY)
Admission: RE | Admit: 2023-03-27 | Discharge: 2023-03-27 | Disposition: A | Discharge status: Home / Self Care | Payer: Medicare Other | Source: Ambulatory Visit | Attending: Cardiovascular Disease | Admitting: Cardiovascular Disease

## 2023-03-27 DIAGNOSIS — I493 Ventricular premature depolarization: Secondary | ICD-10-CM | POA: Diagnosis not present

## 2023-03-27 DIAGNOSIS — I2510 Atherosclerotic heart disease of native coronary artery without angina pectoris: Secondary | ICD-10-CM | POA: Diagnosis not present

## 2023-03-27 DIAGNOSIS — Z955 Presence of coronary angioplasty implant and graft: Secondary | ICD-10-CM | POA: Diagnosis not present

## 2023-03-27 DIAGNOSIS — I6523 Occlusion and stenosis of bilateral carotid arteries: Secondary | ICD-10-CM | POA: Diagnosis not present

## 2023-03-27 DIAGNOSIS — Z86718 Personal history of other venous thrombosis and embolism: Secondary | ICD-10-CM | POA: Diagnosis not present

## 2023-03-27 DIAGNOSIS — E785 Hyperlipidemia, unspecified: Secondary | ICD-10-CM | POA: Diagnosis not present

## 2023-03-27 DIAGNOSIS — I77810 Thoracic aortic ectasia: Secondary | ICD-10-CM | POA: Diagnosis not present

## 2023-03-27 DIAGNOSIS — I10 Essential (primary) hypertension: Secondary | ICD-10-CM | POA: Diagnosis not present

## 2023-03-27 DIAGNOSIS — I447 Left bundle-branch block, unspecified: Secondary | ICD-10-CM | POA: Diagnosis not present

## 2023-03-27 LAB — LIPID PANEL
Chol/HDL Ratio: 3.4 {ratio} (ref 0.0–5.0)
Cholesterol, Total: 109 mg/dL (ref 100–199)
HDL: 32 mg/dL — ABNORMAL LOW (ref >39)
LDL Chol Calc (NIH): 53 mg/dL (ref 0–99)
Triglycerides: 132 mg/dL (ref 0–149)
VLDL Cholesterol Cal: 24 mg/dL (ref 5–40)

## 2023-03-27 LAB — HEPATIC FUNCTION PANEL
ALT: 34 [IU]/L (ref 0–44)
AST: 25 [IU]/L (ref 0–40)
Albumin: 4.3 g/dL (ref 3.8–4.8)
Alkaline Phosphatase: 60 [IU]/L (ref 44–121)
Bilirubin Total: 0.9 mg/dL (ref 0.0–1.2)
Bilirubin, Direct: 0.31 mg/dL (ref 0.00–0.40)
Total Protein: 6.8 g/dL (ref 6.0–8.5)

## 2023-03-29 ENCOUNTER — Telehealth (HOSPITAL_BASED_OUTPATIENT_CLINIC_OR_DEPARTMENT_OTHER): Payer: Self-pay

## 2023-03-29 ENCOUNTER — Encounter (HOSPITAL_COMMUNITY)
Admission: RE | Admit: 2023-03-29 | Discharge: 2023-03-29 | Disposition: A | Discharge status: Home / Self Care | Payer: Medicare Other | Source: Ambulatory Visit | Attending: Cardiovascular Disease | Admitting: Cardiovascular Disease

## 2023-03-29 DIAGNOSIS — Z955 Presence of coronary angioplasty implant and graft: Secondary | ICD-10-CM

## 2023-03-29 NOTE — Telephone Encounter (Signed)
Lab results reviewed via mychart by pt.

## 2023-04-01 ENCOUNTER — Encounter (HOSPITAL_COMMUNITY)
Admission: RE | Admit: 2023-04-01 | Discharge: 2023-04-01 | Disposition: A | Discharge status: Home / Self Care | Payer: Medicare Other | Source: Ambulatory Visit | Attending: Cardiovascular Disease | Admitting: Cardiovascular Disease

## 2023-04-01 DIAGNOSIS — Z955 Presence of coronary angioplasty implant and graft: Secondary | ICD-10-CM | POA: Diagnosis not present

## 2023-04-01 NOTE — Progress Notes (Signed)
Reviewed home exercise Rx with patient today.  Encouraged warm-up, cool-down, and stretching. Reviewed THRR of 57 - 129 and keeping RPE between 11-13. Encouraged to hydrate with activity.  Reviewed weather parameters for temperature and humidity for safe exercise outdoors. Reviewed S/S to terminate exercise and when to call 911 vs MD. Reviewed the use of NTG and pt was encouraged to carry at all times. Pt encouraged to always carry a cell phone for safety when exercising outdoors. Pt verbalized understanding of the home exercise Rx and was provided a copy.  Lorin Picket MS, ACSM-CEP, CCRP

## 2023-04-03 ENCOUNTER — Encounter (HOSPITAL_COMMUNITY)
Admission: RE | Admit: 2023-04-03 | Discharge: 2023-04-03 | Disposition: A | Discharge status: Home / Self Care | Payer: Medicare Other | Source: Ambulatory Visit | Attending: Cardiovascular Disease | Admitting: Cardiovascular Disease

## 2023-04-03 ENCOUNTER — Encounter (HOSPITAL_BASED_OUTPATIENT_CLINIC_OR_DEPARTMENT_OTHER): Payer: BLUE CROSS/BLUE SHIELD | Admitting: Nurse Practitioner

## 2023-04-03 DIAGNOSIS — Z955 Presence of coronary angioplasty implant and graft: Secondary | ICD-10-CM

## 2023-04-04 DIAGNOSIS — Z6834 Body mass index (BMI) 34.0-34.9, adult: Secondary | ICD-10-CM | POA: Diagnosis not present

## 2023-04-04 DIAGNOSIS — Z125 Encounter for screening for malignant neoplasm of prostate: Secondary | ICD-10-CM | POA: Diagnosis not present

## 2023-04-04 DIAGNOSIS — Z0000 Encounter for general adult medical examination without abnormal findings: Secondary | ICD-10-CM | POA: Diagnosis not present

## 2023-04-04 DIAGNOSIS — I2510 Atherosclerotic heart disease of native coronary artery without angina pectoris: Secondary | ICD-10-CM | POA: Diagnosis not present

## 2023-04-04 DIAGNOSIS — E538 Deficiency of other specified B group vitamins: Secondary | ICD-10-CM | POA: Diagnosis not present

## 2023-04-04 DIAGNOSIS — Z1331 Encounter for screening for depression: Secondary | ICD-10-CM | POA: Diagnosis not present

## 2023-04-04 DIAGNOSIS — E6609 Other obesity due to excess calories: Secondary | ICD-10-CM | POA: Diagnosis not present

## 2023-04-04 DIAGNOSIS — D649 Anemia, unspecified: Secondary | ICD-10-CM | POA: Diagnosis not present

## 2023-04-04 DIAGNOSIS — E7800 Pure hypercholesterolemia, unspecified: Secondary | ICD-10-CM | POA: Diagnosis not present

## 2023-04-04 DIAGNOSIS — I6523 Occlusion and stenosis of bilateral carotid arteries: Secondary | ICD-10-CM | POA: Diagnosis not present

## 2023-04-04 DIAGNOSIS — I82412 Acute embolism and thrombosis of left femoral vein: Secondary | ICD-10-CM | POA: Diagnosis not present

## 2023-04-04 DIAGNOSIS — N1831 Chronic kidney disease, stage 3a: Secondary | ICD-10-CM | POA: Diagnosis not present

## 2023-04-04 DIAGNOSIS — Z23 Encounter for immunization: Secondary | ICD-10-CM | POA: Diagnosis not present

## 2023-04-04 DIAGNOSIS — I7121 Aneurysm of the ascending aorta, without rupture: Secondary | ICD-10-CM | POA: Diagnosis not present

## 2023-04-04 DIAGNOSIS — E559 Vitamin D deficiency, unspecified: Secondary | ICD-10-CM | POA: Diagnosis not present

## 2023-04-04 DIAGNOSIS — E66811 Obesity, class 1: Secondary | ICD-10-CM | POA: Diagnosis not present

## 2023-04-04 DIAGNOSIS — I10 Essential (primary) hypertension: Secondary | ICD-10-CM | POA: Diagnosis not present

## 2023-04-04 NOTE — Progress Notes (Signed)
Cardiac Individual Treatment Plan  Patient Details  Name: Kevin Caldwell. MRN: [id number] Date of Birth: 1945/06/17 Referring Provider:   Flowsheet Row INTENSIVE CARDIAC REHAB ORIENT from 02/14/2023 in [facility]  Referring Provider Kristeen Miss, MD       Initial Encounter Date:  Flowsheet Row INTENSIVE CARDIAC REHAB ORIENT from 02/14/2023 in [facility]  Date 02/14/23       Visit Diagnosis: 01/28/23 S/P coronary artery stent placement  Patient's Home Medications on Admission:  Current Outpatient Medications:    amLODipine (NORVASC) 5 MG tablet, Take 5 mg by mouth daily. (Patient not taking: Reported on 03/11/2023), Disp: , Rfl:    apixaban (ELIQUIS) 5 MG TABS tablet, Take 1 tablet (5 mg total) by mouth 2 (two) times daily., Disp: 180 tablet, Rfl: 1   atorvastatin (LIPITOR) 40 MG tablet, Take 1 tablet (40 mg total) by mouth daily., Disp: 90 tablet, Rfl: 1   cholecalciferol (VITAMIN D) 1000 units tablet, Take 1,000 Units by mouth daily., Disp: , Rfl:    clopidogrel (PLAVIX) 75 MG tablet, Take 1 tablet (75 mg total) by mouth daily with breakfast., Disp: 90 tablet, Rfl: 1   nitroGLYCERIN (NITROSTAT) 0.4 MG SL tablet, Place 1 tablet (0.4 mg total) under the tongue every 5 (five) minutes as needed for chest pain., Disp: 25 tablet, Rfl: PRN   valsartan-hydrochlorothiazide (DIOVAN-HCT) 160-12.5 MG tablet, Take 1 tablet by mouth daily., Disp: , Rfl:   Past Medical History: Past Medical History:  Diagnosis Date   DVT (deep venous thrombosis) (HCC)    Hypertension    Renal disorder     Tobacco Use: Social History   Tobacco Use  Smoking Status Never  Smokeless Tobacco Never    Labs: Review Flowsheet       Latest Ref Rng & Units 07/21/2008 03/27/2023  Labs for ITP Cardiac and Pulmonary Rehab  Cholestrol 100 - 199 mg/dL 409  811   LDL (calc) 0 - 99 mg/dL 914   53   HDL-C >78 mg/dL 29.56  32   Trlycerides 0 - 149 mg/dL 21.3  086     Capillary Blood Glucose: No results found for: "GLUCAP"   Exercise Target Goals: Exercise Program Goal: Individual exercise prescription set using results from initial 6 min walk test and THRR while considering  patient's activity barriers and safety.   Exercise Prescription Goal: Initial exercise prescription builds to 30-45 minutes a day of aerobic activity, 2-3 days per week.  Home exercise guidelines will be given to patient during program as part of exercise prescription that the participant will acknowledge.  Activity Barriers & Risk Stratification:  Activity Barriers & Cardiac Risk Stratification - 02/14/23 1257       Activity Barriers & Cardiac Risk Stratification   Activity Barriers Balance Concerns;Deconditioning;Neck/Spine Problems    Cardiac Risk Stratification High             6 Minute Walk:  6 Minute Walk     Row Name 02/14/23 1229         6 Minute Walk   Phase Initial     Distance 1440 feet     Walk Time 6 minutes     # of Rest Breaks 0     MPH 2.73     METS 2.3     RPE 9     Perceived Dyspnea  0     VO2  Peak 2.3     Symptoms No     Resting HR 69 bpm     Resting BP 103/63     Resting Oxygen Saturation  99 %     Exercise Oxygen Saturation  during 6 min walk 98 %     Max Ex. HR 100 bpm     Max Ex. BP 119/72     2 Minute Post BP 104/70              Oxygen Initial Assessment:   Oxygen Re-Evaluation:   Oxygen Discharge (Final Oxygen Re-Evaluation):   Initial Exercise Prescription:  Initial Exercise Prescription - 02/14/23 1300       Date of Initial Exercise RX and Referring Provider   Date 02/14/23    Referring Provider Kristeen Miss, MD    Expected Discharge Date 05/08/23      Bike   Level 2    Watts 25    Minutes 15    METs 2.3      Recumbant Elliptical   Level 2    RPM 50    Watts 25    Minutes 15    METs 2.3      Prescription Details    Frequency (times per week) 3    Duration Progress to 30 minutes of continuous aerobic without signs/symptoms of physical distress      Intensity   THRR 40-80% of Max Heartrate 57-114    Ratings of Perceived Exertion 11-13    Perceived Dyspnea 0-4      Progression   Progression Continue progressive overload as per policy without signs/symptoms or physical distress.      Resistance Training   Training Prescription Yes    Weight 3 lbs    Reps 10-15             Perform Capillary Blood Glucose checks as needed.  Exercise Prescription Changes:   Exercise Prescription Changes     Row Name 02/18/23 1400 03/01/23 1600 03/22/23 1600 03/29/23 1521 04/05/23 1100     Response to Exercise   Blood Pressure (Admit) 100/56 122/68 102/58 98/68 90/60    Blood Pressure (Exercise) 108/70 138/66 -- -- --   Blood Pressure (Exit) 102/60 104/58 100/60 96/68 96/60    Heart Rate (Admit) 61 bpm 59 bpm 60 bpm 73 bpm 54 bpm   Heart Rate (Exercise) 125 bpm 127 bpm 123 bpm 130 bpm 117 bpm   Heart Rate (Exit) 77 bpm 75 bpm 80 bpm 87 bpm 78 bpm   Rating of Perceived Exertion (Exercise) 11 11 10 11 10    Symptoms None None None None None   Comments Pt's first day in the CRP2 program Reviewed METs Reviewed METs and goals Reviewed home exercise Rx Reviewed METs   Duration Continue with 30 min of aerobic exercise without signs/symptoms of physical distress. Continue with 30 min of aerobic exercise without signs/symptoms of physical distress. Continue with 30 min of aerobic exercise without signs/symptoms of physical distress. Continue with 30 min of aerobic exercise without signs/symptoms of physical distress. Continue with 30 min of aerobic exercise without signs/symptoms of physical distress.   Intensity THRR unchanged THRR unchanged THRR unchanged THRR unchanged THRR unchanged     Progression   Progression Continue to progress workloads to maintain intensity without signs/symptoms of physical distress.  Continue to progress workloads to maintain intensity without signs/symptoms of physical distress. Continue to progress workloads to maintain intensity without signs/symptoms of physical distress. Continue to progress workloads to maintain intensity without  signs/symptoms of physical distress. Continue to progress workloads to maintain intensity without signs/symptoms of physical distress.   Average METs 3 3.9 3.1 3.3 3.4     Resistance Training   Training Prescription Yes Yes Yes Yes Yes   Weight 3 lbs 3 lbs 4 lbs 4 lbs 4 lbs   Reps 10-15 10-15 10-15 10-15 10-15   Time 10 Minutes 10 Minutes 10 Minutes 10 Minutes 10 Minutes     Interval Training   Interval Training No No No No No     Bike   Level 2 2 2 3  3.5   Watts 33 33 36 -- 51   Minutes 15 15 15 15 15    METs 2.8 3 3  3.4 3.4     Recumbant Elliptical   Level 2 2 3 3 4    RPM 66 58 80 66 62   Watts 98 89 36 83 101   Minutes 15 15 15 15 15    METs 3.2 3 3.2 3.2 3.4     Home Exercise Plan   Plans to continue exercise at -- -- -- Home (comment) Home (comment)   Frequency -- -- -- Add 3 additional days to program exercise sessions. Add 3 additional days to program exercise sessions.   Initial Home Exercises Provided -- -- -- 03/29/23 03/29/23            Exercise Comments:   Exercise Comments     Row Name 02/18/23 1446 03/01/23 1528 03/22/23 1630 03/29/23 1526 04/05/23 1127   Exercise Comments Pt's first day in the CRP2 program. no complaints with today's session. Pt is concerned about his blood pressure being low normal. Pt is asymptoamtic. Explained to patient we will continue to monitor during exercise. Reviewed METs with patient today. No increases in workloads as pt exceeds THRR. Will request increase in THRR in this persists. Reviewed METs and goals. Will consider increasing workloads on modalities next session. Reviewed home exercise Rx. Pt has been walking and using treadmill at home 2-3x/week in addition to the program. Pt  will continue with this program at home. Pt verbalized understanding of the home exercise Rx and was provided a copy. Reviewed METs. Pt increased workloads on both modalities today.            Exercise Goals and Review:   Exercise Goals     Row Name 02/14/23 1104             Exercise Goals   Increase Physical Activity Yes       Intervention Provide advice, education, support and counseling about physical activity/exercise needs.;Develop an individualized exercise prescription for aerobic and resistive training based on initial evaluation findings, risk stratification, comorbidities and participant's personal goals.       Expected Outcomes Long Term: Exercising regularly at least 3-5 days a week.;Short Term: Attend rehab on a regular basis to increase amount of physical activity.;Long Term: Add in home exercise to make exercise part of routine and to increase amount of physical activity.       Increase Strength and Stamina Yes       Intervention Provide advice, education, support and counseling about physical activity/exercise needs.;Develop an individualized exercise prescription for aerobic and resistive training based on initial evaluation findings, risk stratification, comorbidities and participant's personal goals.       Expected Outcomes Short Term: Increase workloads from initial exercise prescription for resistance, speed, and METs.;Short Term: Perform resistance training exercises routinely during rehab and add in resistance training at home;Long  Term: Improve cardiorespiratory fitness, muscular endurance and strength as measured by increased METs and functional capacity ( )       Able to understand and use rate of perceived exertion (RPE) scale Yes       Intervention Provide education and explanation on how to use RPE scale       Expected Outcomes Short Term: Able to use RPE daily in rehab to express subjective intensity level;Long Term:  Able to use RPE to guide intensity  level when exercising independently       Knowledge and understanding of Target Heart Rate Range (THRR) Yes       Intervention Provide education and explanation of THRR including how the numbers were predicted and where they are located for reference       Expected Outcomes Short Term: Able to state/look up THRR;Short Term: Able to use daily as guideline for intensity in rehab;Long Term: Able to use THRR to govern intensity when exercising independently       Understanding of Exercise Prescription Yes       Intervention Provide education, explanation, and written materials on patient's individual exercise prescription       Expected Outcomes Short Term: Able to explain program exercise prescription;Long Term: Able to explain home exercise prescription to exercise independently                Exercise Goals Re-Evaluation :  Exercise Goals Re-Evaluation     Row Name 02/18/23 1444 03/22/23 1628           Exercise Goal Re-Evaluation   Exercise Goals Review Increase Physical Activity;Increase Strength and Stamina;Able to understand and use rate of perceived exertion (RPE) scale;Knowledge and understanding of Target Heart Rate Range (THRR);Understanding of Exercise Prescription Increase Physical Activity;Increase Strength and Stamina;Able to understand and use rate of perceived exertion (RPE) scale;Knowledge and understanding of Target Heart Rate Range (THRR);Understanding of Exercise Prescription      Comments Pt's first day in hte CRP2 program. Pt understnads the RPE scale, THRR and Exercise Rx. Reviewed METs and goals today. Pt voices he is making progress on his goal of improving his strength and stamina.      Expected Outcomes Will continue to monitor the patient and progress exercise workloads as tolerated. Will continue to monitor the patient and progress exercise workloads as tolerated.               Discharge Exercise Prescription (Final Exercise Prescription Changes):  Exercise  Prescription Changes - 04/05/23 1100       Response to Exercise   Blood Pressure (Admit) 90/60    Blood Pressure (Exit) 96/60    Heart Rate (Admit) 54 bpm    Heart Rate (Exercise) 117 bpm    Heart Rate (Exit) 78 bpm    Rating of Perceived Exertion (Exercise) 10    Symptoms None    Comments Reviewed METs    Duration Continue with 30 min of aerobic exercise without signs/symptoms of physical distress.    Intensity THRR unchanged      Progression   Progression Continue to progress workloads to maintain intensity without signs/symptoms of physical distress.    Average METs 3.4      Resistance Training   Training Prescription Yes    Weight 4 lbs    Reps 10-15    Time 10 Minutes      Interval Training   Interval Training No      Bike   Level 3.5    Watts 51  Minutes 15    METs 3.4      Recumbant Elliptical   Level 4    RPM 62    Watts 101    Minutes 15    METs 3.4      Home Exercise Plan   Plans to continue exercise at Home (comment)    Frequency Add 3 additional days to program exercise sessions.    Initial Home Exercises Provided 03/29/23             Nutrition:  Target Goals: Understanding of nutrition guidelines, daily intake of sodium 1500mg , cholesterol 200mg , calories 30% from fat and 7% or less from saturated fats, daily to have 5 or more servings of fruits and vegetables.  Biometrics:  Pre Biometrics - 02/14/23 1115       Pre Biometrics   Waist Circumference 44.5 inches    Hip Circumference 47 inches    Waist to Hip Ratio 0.95 %    Triceps Skinfold 20 mm    % Body Fat 33.3 %    Grip Strength 40 kg    Flexibility 9 in    Single Leg Stand 5.18 seconds              Nutrition Therapy Plan and Nutrition Goals:   Nutrition Assessments:  MEDIFICTS Score Key: >=70 Need to make dietary changes  40-70 Heart Healthy Diet <= 40 Therapeutic Level Cholesterol Diet   Flowsheet Row INTENSIVE CARDIAC REHAB from 02/18/2023 in [facility]  Picture Your Plate Total Score on Admission 67      Picture Your Plate Scores: <21 Unhealthy dietary pattern with much room for improvement. 41-50 Dietary pattern unlikely to meet recommendations for good health and room for improvement. 51-60 More healthful dietary pattern, with some room for improvement.  >60 Healthy dietary pattern, although there may be some specific behaviors that could be improved.    Nutrition Goals Re-Evaluation:   Nutrition Goals Re-Evaluation:   Nutrition Goals Discharge (Final Nutrition Goals Re-Evaluation):   Psychosocial: Target Goals: Acknowledge presence or absence of significant depression and/or stress, maximize coping skills, provide positive support system. Participant is able to verbalize types and ability to use techniques and skills needed for reducing stress and depression.  Initial Review & Psychosocial Screening:  Initial Psych Review & Screening - 02/14/23 1301       Initial Review   Current issues with None Identified;Current Stress Concerns    Source of Stress Concerns Occupation    Comments Voices stress level is low      Family Dynamics   Good Support System? --   Pt has spouse, son and daughter for support     Barriers   Psychosocial barriers to participate in program There are no identifiable barriers or psychosocial needs.      Screening Interventions   Interventions Encouraged to exercise             Quality of Life Scores:  Quality of Life - 02/14/23 1304       Quality of Life   Select Quality of Life      Quality of Life Scores   Health/Function Pre 29.08 %    Socioeconomic Pre 30 %    Psych/Spiritual Pre 30 %    Family Pre 30 %    GLOBAL Pre 29.63 %            Scores of 19 and below usually indicate a poorer quality of  life in these areas.  A difference of  2-3 points is a clinically meaningful difference.  A difference of 2-3 points in  the total score of the Quality of Life Index has been associated with significant improvement in overall quality of life, self-image, physical symptoms, and general health in studies assessing change in quality of life.  PHQ-9: Review Flowsheet       02/14/2023  Depression screen PHQ 2/9  Decreased Interest 0  Down, Depressed, Hopeless 0  PHQ - 2 Score 0  Altered sleeping 0  Tired, decreased energy 0  Change in appetite 0  Feeling bad or failure about yourself  0  Trouble concentrating 0  Moving slowly or fidgety/restless 0  Suicidal thoughts 0  PHQ-9 Score 0  Difficult doing work/chores Not difficult at all   Interpretation of Total Score  Total Score Depression Severity:  1-4 = Minimal depression, 5-9 = Mild depression, 10-14 = Moderate depression, 15-19 = Moderately severe depression, 20-27 = Severe depression   Psychosocial Evaluation and Intervention:   Psychosocial Re-Evaluation:  Psychosocial Re-Evaluation     Row Name 02/19/23 0920 03/08/23 1107 04/04/23 0908         Psychosocial Re-Evaluation   Current issues with None Identified None Identified None Identified     Comments Kevin Caldwell did not voice any increased concerns or stressors on his first day of exercise. -- --     Interventions Encouraged to attend Cardiac Rehabilitation for the exercise Encouraged to attend Cardiac Rehabilitation for the exercise Encouraged to attend Cardiac Rehabilitation for the exercise     Continue Psychosocial Services  No Follow up required No Follow up required No Follow up required              Psychosocial Discharge (Final Psychosocial Re-Evaluation):  Psychosocial Re-Evaluation - 04/04/23 0908       Psychosocial Re-Evaluation   Current issues with None Identified    Interventions Encouraged to attend Cardiac Rehabilitation for the exercise    Continue Psychosocial Services  No Follow up required             Vocational Rehabilitation: Provide vocational rehab  assistance to qualifying candidates.   Vocational Rehab Evaluation & Intervention:  Vocational Rehab - 02/14/23 1305       Initial Vocational Rehab Evaluation & Intervention   Assessment shows need for Vocational Rehabilitation No   Pt is currently working            Education: Education Goals: Education classes will be provided on a weekly basis, covering required topics. Participant will state understanding/return demonstration of topics presented.    Education     Row Name 02/18/23 0900     Education   Cardiac Education Topics Pritikin   Select Workshops     Workshops   Educator Exercise Physiologist   Select Exercise   Exercise Workshop Exercise Basics: Building Your Action Plan   Instruction Review Code 1- Verbalizes Understanding   Class Start Time [phone]   Class Stop Time 0850   Class Time Calculation (min) 38 min    Row Name 02/20/23 1300     Education   Cardiac Education Topics Pritikin   Secondary school teacher School   Educator Nurse   Weekly Topic One-Pot Wonders   Instruction Review Code 1- Verbalizes Understanding   Class Start Time [phone]   Class Stop Time 0849   Class Time Calculation (min) 33 min    Row Name 02/22/23 0800  Education   Cardiac Education Topics Pritikin   Psychologist, forensic Exercise Education   Exercise Education Move It!   Instruction Review Code 1- Verbalizes Understanding   Class Start Time 0815   Class Stop Time 0855   Class Time Calculation (min) 40 min    Row Name 02/25/23 0800     Education   Cardiac Education Topics Pritikin   Psychologist, forensic General Education   General Education Hypertension and Heart Disease   Instruction Review Code 1- Verbalizes Understanding   Class Start Time [phone]   Class Stop Time 0854   Class Time Calculation (min) 38 min    Row Name 03/01/23 0900      Education   Cardiac Education Topics Pritikin   Glass blower/designer Nutrition   Nutrition Workshop Targeting Your Nutrition Priorities   Instruction Review Code 1- Verbalizes Understanding   Class Start Time 0815   Class Stop Time 0849   Class Time Calculation (min) 34 min    Row Name 03/04/23 0900     Education   Cardiac Education Topics Pritikin   Select Workshops     Workshops   Educator Exercise Physiologist   Select Psychosocial   Psychosocial Workshop Focused Goals, Sustainable Changes   Instruction Review Code 1- Verbalizes Understanding   Class Start Time 0815   Class Stop Time 0848   Class Time Calculation (min) 33 min    Row Name 03/08/23 1000     Education   Cardiac Education Topics Pritikin   Select Core Videos     Core Videos   Educator Dietitian   Select Nutrition   Nutrition Dining Out - Part 1   Instruction Review Code 1- Verbalizes Understanding   Class Start Time [phone]   Class Stop Time 0906   Class Time Calculation (min) 49 min    Row Name 03/11/23 0800     Education   Cardiac Education Topics Pritikin   Select Core Videos     Core Videos   Educator Exercise Physiologist   Select Exercise Education   Exercise Education Biomechanial Limitations   Instruction Review Code 1- Verbalizes Understanding   Class Start Time 0815   Class Stop Time 0848   Class Time Calculation (min) 33 min    Row Name 03/13/23 1100     Education   Cardiac Education Topics Pritikin   Secondary school teacher School   Educator Nurse;Respiratory Therapist   Weekly Topic Comforting Weekend Breakfasts   Instruction Review Code 1- Verbalizes Understanding   Class Start Time 0818   Class Stop Time 0858   Class Time Calculation (min) 40 min    Row Name 03/15/23 0900     Education   Cardiac Education Topics Pritikin   Select Core Videos     Core Videos   Educator Dietitian   Select Nutrition    Nutrition Vitamins and Minerals   Instruction Review Code 1- Verbalizes Understanding   Class Start Time 0815   Class Stop Time 0858   Class Time Calculation (min) 43 min    Row Name 03/18/23 0800     Education   Cardiac Education Topics Pritikin   Nurse, children's  Exercise Physiologist   Select Exercise Education   Exercise Education Improving Performance   Instruction Review Code 1- Verbalizes Understanding   Class Start Time [phone]   Class Stop Time 0850   Class Time Calculation (min) 38 min    Row Name 03/20/23 1300     Education   Cardiac Education Topics Pritikin   Secondary school teacher School   Educator Nurse;Respiratory Therapist   Weekly Topic Fast Evening Meals   Instruction Review Code 1- Verbalizes Understanding   Class Start Time [phone]   Class Stop Time 5409   Class Time Calculation (min) 36 min    Row Name 03/22/23 0900     Education   Cardiac Education Topics Pritikin   Select Workshops     Workshops   Educator Dietitian   Select Nutrition   Nutrition Workshop Fueling a Forensic psychologist   Instruction Review Code 1- Verbalizes Understanding   Class Start Time 0815   Class Stop Time 0850   Class Time Calculation (min) 35 min    Row Name 03/25/23 1100     Education   Cardiac Education Topics Pritikin   Select Workshops     Workshops   Educator Exercise Physiologist   Select Psychosocial   Psychosocial Workshop Healthy Sleep for a Healthy Heart   Instruction Review Code 1- Verbalizes Understanding   Class Start Time [phone]   Class Stop Time 0857   Class Time Calculation (min) 43 min    Row Name 03/27/23 1200     Education   Cardiac Education Topics Pritikin   Secondary school teacher School   Educator Nurse;Respiratory Therapist   Weekly Topic International Cuisine- Spotlight on the [facility] Zones   Instruction Review Code 1- Verbalizes Understanding   Class Start Time 0815   Class Stop Time  0851   Class Time Calculation (min) 36 min    Row Name 03/29/23 1300     Education   Select Core Videos     Core Videos   Educator Exercise Physiologist   Select Psychosocial   Psychosocial How Our Thoughts Can Heal Our Hearts   Instruction Review Code 1- Verbalizes Understanding   Class Start Time [phone]   Class Stop Time 0846   Class Time Calculation (min) 32 min    Row Name 04/01/23 0800     Education   Cardiac Education Topics Pritikin   Select Workshops     Workshops   Educator Nurse   Select Exercise   Exercise Workshop Managing Heart Disease: Your Path to a Healthier Heart   Instruction Review Code 1- Verbalizes Understanding   Class Start Time [phone]   Class Stop Time 0846   Class Time Calculation (min) 35 min    Row Name 04/03/23 1000     Education   Cardiac Education Topics Pritikin   Secondary school teacher School   Educator Dietitian;Respiratory Therapist   Weekly Topic Simple Sides and Sauces   Instruction Review Code 1- Verbalizes Understanding   Class Start Time 0815   Class Stop Time 0847   Class Time Calculation (min) 32 min    Row Name 04/05/23 0800     Education   Cardiac Education Topics Pritikin   Hospital doctor Education   General Education Hypertension and Heart Disease   Instruction Review Code 1- Verbalizes Understanding  Class Start Time 0815   Class Stop Time 0848   Class Time Calculation (min) 33 min    Row Name 04/08/23 0800     Education   Cardiac Education Topics Pritikin   Geographical information systems officer Psychosocial   Psychosocial Workshop From Head to Heart: The Power of a Healthy Outlook   Instruction Review Code 1- Verbalizes Understanding            Core Videos: Exercise    Move It!  Clinical staff conducted group or individual video education with verbal and written material and  guidebook.  Patient learns the recommended Pritikin exercise program. Exercise with the goal of living a long, healthy life. Some of the health benefits of exercise include controlled diabetes, healthier blood pressure levels, improved cholesterol levels, improved heart and lung capacity, improved sleep, and better body composition. Everyone should speak with their doctor before starting or changing an exercise routine.  Biomechanical Limitations Clinical staff conducted group or individual video education with verbal and written material and guidebook.  Patient learns how biomechanical limitations can impact exercise and how we can mitigate and possibly overcome limitations to have an impactful and balanced exercise routine.  Body Composition Clinical staff conducted group or individual video education with verbal and written material and guidebook.  Patient learns that body composition (ratio of muscle mass to fat mass) is a key component to assessing overall fitness, rather than body weight alone. Increased fat mass, especially visceral belly fat, can put [place] at increased risk for metabolic syndrome, type 2 diabetes, heart disease, and even death. It is recommended to combine diet and exercise (cardiovascular and resistance training) to improve your body composition. Seek guidance from your physician and exercise physiologist before implementing an exercise routine.  Exercise Action Plan Clinical staff conducted group or individual video education with verbal and written material and guidebook.  Patient learns the recommended strategies to achieve and enjoy long-term exercise adherence, including variety, self-motivation, self-efficacy, and positive decision making. Benefits of exercise include fitness, good health, weight management, more energy, better sleep, less stress, and overall well-being.  Medical   Heart Disease Risk Reduction Clinical staff conducted group or individual video education  with verbal and written material and guidebook.  Patient learns our heart is our most vital organ as it circulates oxygen, nutrients, white blood cells, and hormones throughout the entire body, and carries waste away. Data supports a plant-based eating plan like the Pritikin Program for its effectiveness in slowing progression of and reversing heart disease. The video provides a number of recommendations to address heart disease.   Metabolic Syndrome and Belly Fat  Clinical staff conducted group or individual video education with verbal and written material and guidebook.  Patient learns what metabolic syndrome is, how it leads to heart disease, and how one can reverse it and keep it from coming back. You have metabolic syndrome if you have 3 of the following 5 criteria: abdominal obesity, high blood pressure, high triglycerides, low HDL cholesterol, and high blood sugar.  Hypertension and Heart Disease Clinical staff conducted group or individual video education with verbal and written material and guidebook.  Patient learns that high blood pressure, or hypertension, is very common in the [place]. Hypertension is largely due to excessive salt intake, but other important risk factors include being overweight, physical inactivity, drinking too much alcohol, smoking, and not eating enough potassium from fruits and vegetables. High blood  pressure is a leading risk factor for heart attack, stroke, congestive heart failure, dementia, kidney failure, and premature death. Long-term effects of excessive salt intake include stiffening of the arteries and thickening of heart muscle and organ damage. Recommendations include ways to reduce hypertension and the risk of heart disease.  Diseases of Our Time - Focusing on Diabetes Clinical staff conducted group or individual video education with verbal and written material and guidebook.  Patient learns why the best way to stop diseases of our time is prevention,  through food and other lifestyle changes. Medicine (such as prescription pills and surgeries) is often only a Band-Aid on the problem, not a long-term solution. Most common diseases of our time include obesity, type 2 diabetes, hypertension, heart disease, and cancer. The Pritikin Program is recommended and has been proven to help reduce, reverse, and/or prevent the damaging effects of metabolic syndrome.  Nutrition   Overview of the Pritikin Eating Plan  Clinical staff conducted group or individual video education with verbal and written material and guidebook.  Patient learns about the Pritikin Eating Plan for disease risk reduction. The Pritikin Eating Plan emphasizes a wide variety of unrefined, minimally-processed carbohydrates, like fruits, vegetables, whole grains, and legumes. Go, Caution, and Stop food choices are explained. Plant-based and lean animal proteins are emphasized. Rationale provided for low sodium intake for blood pressure control, low added sugars for blood sugar stabilization, and low added fats and oils for coronary artery disease risk reduction and weight management.  Calorie Density  Clinical staff conducted group or individual video education with verbal and written material and guidebook.  Patient learns about calorie density and how it impacts the Pritikin Eating Plan. Knowing the characteristics of the food you choose will help you decide whether those foods will lead to weight gain or weight loss, and whether you want to consume more or less of them. Weight loss is usually a side effect of the Pritikin Eating Plan because of its focus on low calorie-dense foods.  Label Reading  Clinical staff conducted group or individual video education with verbal and written material and guidebook.  Patient learns about the Pritikin recommended label reading guidelines and corresponding recommendations regarding calorie density, added sugars, sodium content, and whole grains.  Dining  Out - Part 1  Clinical staff conducted group or individual video education with verbal and written material and guidebook.  Patient learns that restaurant meals can be sabotaging because they can be so high in calories, fat, sodium, and/or sugar. Patient learns recommended strategies on how to positively address this and avoid unhealthy pitfalls.  Facts on Fats  Clinical staff conducted group or individual video education with verbal and written material and guidebook.  Patient learns that lifestyle modifications can be just as effective, if not more so, as many medications for lowering your risk of heart disease. A Pritikin lifestyle can help to reduce your risk of inflammation and atherosclerosis (cholesterol build-up, or plaque, in the artery walls). Lifestyle interventions such as dietary choices and physical activity address the cause of atherosclerosis. A review of the types of fats and their impact on blood cholesterol levels, along with dietary recommendations to reduce fat intake is also included.  Nutrition Action Plan  Clinical staff conducted group or individual video education with verbal and written material and guidebook.  Patient learns how to incorporate Pritikin recommendations into their lifestyle. Recommendations include planning and keeping personal health goals in mind as an important part of their success.  Healthy Mind-Set  Healthy Minds, Bodies, Hearts  Clinical staff conducted group or individual video education with verbal and written material and guidebook.  Patient learns how to identify when they are stressed. Video will discuss the impact of that stress, as well as the many benefits of stress management. Patient will also be introduced to stress management techniques. The way we think, act, and feel has an impact on our hearts.  How Our Thoughts Can Heal Our Hearts  Clinical staff conducted group or individual video education with verbal and written material and  guidebook.  Patient learns that negative thoughts can cause depression and anxiety. This can result in negative lifestyle behavior and serious health problems. Cognitive behavioral therapy is an effective method to help control our thoughts in order to change and improve our emotional outlook.  Additional Videos:  Exercise    Improving Performance  Clinical staff conducted group or individual video education with verbal and written material and guidebook.  Patient learns to use a non-linear approach by alternating intensity levels and lengths of time spent exercising to help burn more calories and lose more body fat. Cardiovascular exercise helps improve heart health, metabolism, hormonal balance, blood sugar control, and recovery from fatigue. Resistance training improves strength, endurance, balance, coordination, reaction time, metabolism, and muscle mass. Flexibility exercise improves circulation, posture, and balance. Seek guidance from your physician and exercise physiologist before implementing an exercise routine and learn your capabilities and proper form for all exercise.  Introduction to Yoga  Clinical staff conducted group or individual video education with verbal and written material and guidebook.  Patient learns about yoga, a discipline of the coming together of mind, breath, and body. The benefits of yoga include improved flexibility, improved range of motion, better posture and core strength, increased lung function, weight loss, and positive self-image. Yoga's heart health benefits include lowered blood pressure, healthier heart rate, decreased cholesterol and triglyceride levels, improved immune function, and reduced stress. Seek guidance from your physician and exercise physiologist before implementing an exercise routine and learn your capabilities and proper form for all exercise.  Medical   Aging: Enhancing Your Quality of Life  Clinical staff conducted group or individual  video education with verbal and written material and guidebook.  Patient learns key strategies and recommendations to stay in good physical health and enhance quality of life, such as prevention strategies, having an advocate, securing a Health Care Proxy and Power of Attorney, and keeping a list of medications and system for tracking them. It also discusses how to avoid risk for bone loss.  Biology of Weight Control  Clinical staff conducted group or individual video education with verbal and written material and guidebook.  Patient learns that weight gain occurs because we consume more calories than we burn (eating more, moving less). Even if your body weight is normal, you may have higher ratios of fat compared to muscle mass. Too much body fat puts you at increased risk for cardiovascular disease, heart attack, stroke, type 2 diabetes, and obesity-related cancers. In addition to exercise, following the Pritikin Eating Plan can help reduce your risk.  Decoding Lab Results  Clinical staff conducted group or individual video education with verbal and written material and guidebook.  Patient learns that lab test reflects one measurement whose values change over time and are influenced by many factors, including medication, stress, sleep, exercise, food, hydration, pre-existing medical conditions, and more. It is recommended to use the knowledge from this video to become more involved with your lab results  and evaluate your numbers to speak with your doctor.   Diseases of Our Time - Overview  Clinical staff conducted group or individual video education with verbal and written material and guidebook.  Patient learns that according to the CDC, 50% to 70% of chronic diseases (such as obesity, type 2 diabetes, elevated lipids, hypertension, and heart disease) are avoidable through lifestyle improvements including healthier food choices, listening to satiety cues, and increased physical activity.  Sleep  Disorders Clinical staff conducted group or individual video education with verbal and written material and guidebook.  Patient learns how good quality and duration of sleep are important to overall health and well-being. Patient also learns about sleep disorders and how they impact health along with recommendations to address them, including discussing with a physician.  Nutrition  Dining Out - Part 2 Clinical staff conducted group or individual video education with verbal and written material and guidebook.  Patient learns how to plan ahead and communicate in order to maximize their dining experience in a healthy and nutritious manner. Included are recommended food choices based on the type of restaurant the patient is visiting.   Fueling a Banker conducted group or individual video education with verbal and written material and guidebook.  There is a strong connection between our food choices and our health. Diseases like obesity and type 2 diabetes are very prevalent and are in large-part due to lifestyle choices. The Pritikin Eating Plan provides plenty of food and hunger-curbing satisfaction. It is easy to follow, affordable, and helps reduce health risks.  Menu Workshop  Clinical staff conducted group or individual video education with verbal and written material and guidebook.  Patient learns that restaurant meals can sabotage health goals because they are often packed with calories, fat, sodium, and sugar. Recommendations include strategies to plan ahead and to communicate with the manager, chef, or server to help order a healthier meal.  Planning Your Eating Strategy  Clinical staff conducted group or individual video education with verbal and written material and guidebook.  Patient learns about the Pritikin Eating Plan and its benefit of reducing the risk of disease. The Pritikin Eating Plan does not focus on calories. Instead, it emphasizes high-quality,  nutrient-rich foods. By knowing the characteristics of the foods, we choose, we can determine their calorie density and make informed decisions.  Targeting Your Nutrition Priorities  Clinical staff conducted group or individual video education with verbal and written material and guidebook.  Patient learns that lifestyle habits have a tremendous impact on disease risk and progression. This video provides eating and physical activity recommendations based on your personal health goals, such as reducing LDL cholesterol, losing weight, preventing or controlling type 2 diabetes, and reducing high blood pressure.  Vitamins and Minerals  Clinical staff conducted group or individual video education with verbal and written material and guidebook.  Patient learns different ways to obtain key vitamins and minerals, including through a recommended healthy diet. It is important to discuss all supplements you take with your doctor.   Healthy Mind-Set    Smoking Cessation  Clinical staff conducted group or individual video education with verbal and written material and guidebook.  Patient learns that cigarette smoking and tobacco addiction pose a serious health risk which affects millions of people. Stopping smoking will significantly reduce the risk of heart disease, lung disease, and many forms of cancer. Recommended strategies for quitting are covered, including working with your doctor to develop a successful plan.  Culinary  Becoming a Set designer conducted group or individual video education with verbal and written material and guidebook.  Patient learns that cooking at home can be healthy, cost-effective, quick, and puts them in control. Keys to cooking healthy recipes will include looking at your recipe, assessing your equipment needs, planning ahead, making it simple, choosing cost-effective seasonal ingredients, and limiting the use of added fats, salts, and sugars.  Cooking -  Breakfast and Snacks  Clinical staff conducted group or individual video education with verbal and written material and guidebook.  Patient learns how important breakfast is to satiety and nutrition through the entire day. Recommendations include key foods to eat during breakfast to help stabilize blood sugar levels and to prevent overeating at meals later in the day. Planning ahead is also a key component.  Cooking - Educational psychologist conducted group or individual video education with verbal and written material and guidebook.  Patient learns eating strategies to improve overall health, including an approach to cook more at home. Recommendations include thinking of animal protein as a side on your plate rather than center stage and focusing instead on lower calorie dense options like vegetables, fruits, whole grains, and plant-based proteins, such as beans. Making sauces in large quantities to freeze for later and leaving the skin on your vegetables are also recommended to maximize your experience.  Cooking - Healthy Salads and Dressing Clinical staff conducted group or individual video education with verbal and written material and guidebook.  Patient learns that vegetables, fruits, whole grains, and legumes are the foundations of the Pritikin Eating Plan. Recommendations include how to incorporate each of these in flavorful and healthy salads, and how to create homemade salad dressings. Proper handling of ingredients is also covered. Cooking - Soups and [employer] - Soups and Desserts Clinical staff conducted group or individual video education with verbal and written material and guidebook.  Patient learns that Pritikin soups and desserts make for easy, nutritious, and delicious snacks and meal components that are low in sodium, fat, sugar, and calorie density, while high in vitamins, minerals, and filling fiber. Recommendations include simple and healthy ideas for soups and  desserts.   Overview     The Pritikin Solution Program Overview Clinical staff conducted group or individual video education with verbal and written material and guidebook.  Patient learns that the results of the Pritikin Program have been documented in more than 100 articles published in peer-reviewed journals, and the benefits include reducing risk factors for (and, in some cases, even reversing) high cholesterol, high blood pressure, type 2 diabetes, obesity, and more! An overview of the three key pillars of the Pritikin Program will be covered: eating well, doing regular exercise, and having a healthy mind-set.  WORKSHOPS  Exercise: Exercise Basics: Building Your Action Plan Clinical staff led group instruction and group discussion with PowerPoint presentation and patient guidebook. To enhance the learning environment the use of posters, models and videos may be added. At the conclusion of this workshop, patients will comprehend the difference between physical activity and exercise, as well as the benefits of incorporating both, into their routine. Patients will understand the FITT (Frequency, Intensity, Time, and Type) principle and how to use it to build an exercise action plan. In addition, safety concerns and other considerations for exercise and cardiac rehab will be addressed by the presenter. The purpose of this lesson is to promote a comprehensive and effective weekly exercise routine in order to improve  patients' overall level of fitness.   Managing Heart Disease: Your Path to a Healthier Heart Clinical staff led group instruction and group discussion with PowerPoint presentation and patient guidebook. To enhance the learning environment the use of posters, models and videos may be added.At the conclusion of this workshop, patients will understand the anatomy and physiology of the heart. Additionally, they will understand how Pritikin's three pillars impact the risk factors, the  progression, and the management of heart disease.  The purpose of this lesson is to provide a high-level overview of the heart, heart disease, and how the Pritikin lifestyle positively impacts risk factors.  Exercise Biomechanics Clinical staff led group instruction and group discussion with PowerPoint presentation and patient guidebook. To enhance the learning environment the use of posters, models and videos may be added. Patients will learn how the structural parts of their bodies function and how these functions impact their daily activities, movement, and exercise. Patients will learn how to promote a neutral spine, learn how to manage pain, and identify ways to improve their physical movement in order to promote healthy living. The purpose of this lesson is to expose patients to common physical limitations that impact physical activity. Participants will learn practical ways to adapt and manage aches and pains, and to minimize their effect on regular exercise. Patients will learn how to maintain good posture while sitting, walking, and lifting.  Balance Training and Fall Prevention  Clinical staff led group instruction and group discussion with PowerPoint presentation and patient guidebook. To enhance the learning environment the use of posters, models and videos may be added. At the conclusion of this workshop, patients will understand the importance of their sensorimotor skills (vision, proprioception, and the vestibular system) in maintaining their ability to balance as they age. Patients will apply a variety of balancing exercises that are appropriate for their current level of function. Patients will understand the common causes for poor balance, possible solutions to these problems, and ways to modify their physical environment in order to minimize their fall risk. The purpose of this lesson is to teach patients about the importance of maintaining balance as they age and ways to  minimize their risk of falling.  WORKSHOPS   Nutrition:  Fueling a Ship broker led group instruction and group discussion with PowerPoint presentation and patient guidebook. To enhance the learning environment the use of posters, models and videos may be added. Patients will review the foundational principles of the Pritikin Eating Plan and understand what constitutes a serving size in each of the food groups. Patients will also learn Pritikin-friendly foods that are better choices when away from home and review make-ahead meal and snack options. Calorie density will be reviewed and applied to three nutrition priorities: weight maintenance, weight loss, and weight gain. The purpose of this lesson is to reinforce (in a group setting) the key concepts around what patients are recommended to eat and how to apply these guidelines when away from home by planning and selecting Pritikin-friendly options. Patients will understand how calorie density may be adjusted for different weight management goals.  Mindful Eating  Clinical staff led group instruction and group discussion with PowerPoint presentation and patient guidebook. To enhance the learning environment the use of posters, models and videos may be added. Patients will briefly review the concepts of the Pritikin Eating Plan and the importance of low-calorie dense foods. The concept of mindful eating will be introduced as well as the importance of paying attention to internal  hunger signals. Triggers for non-hunger eating and techniques for dealing with triggers will be explored. The purpose of this lesson is to provide patients with the opportunity to review the basic principles of the Pritikin Eating Plan, discuss the value of eating mindfully and how to measure internal cues of hunger and fullness using the Hunger Scale. Patients will also discuss reasons for non-hunger eating and learn strategies to use for controlling emotional  eating.  Targeting Your Nutrition Priorities Clinical staff led group instruction and group discussion with PowerPoint presentation and patient guidebook. To enhance the learning environment the use of posters, models and videos may be added. Patients will learn how to determine their genetic susceptibility to disease by reviewing their family history. Patients will gain insight into the importance of diet as part of an overall healthy lifestyle in mitigating the impact of genetics and other environmental insults. The purpose of this lesson is to provide patients with the opportunity to assess their personal nutrition priorities by looking at their family history, their own health history and current risk factors. Patients will also be able to discuss ways of prioritizing and modifying the Pritikin Eating Plan for their highest risk areas  Menu  Clinical staff led group instruction and group discussion with PowerPoint presentation and patient guidebook. To enhance the learning environment the use of posters, models and videos may be added. Using menus brought in from [employer], or printed from [employer], patients will apply the Pritikin dining out guidelines that were presented in the [employer] video. Patients will also be able to practice these guidelines in a variety of provided scenarios. The purpose of this lesson is to provide patients with the opportunity to practice hands-on learning of the Pritikin Dining Out guidelines with actual menus and practice scenarios.  Label Reading Clinical staff led group instruction and group discussion with PowerPoint presentation and patient guidebook. To enhance the learning environment the use of posters, models and videos may be added. Patients will review and discuss the Pritikin label reading guidelines presented in [employer]'s Label Reading Educational series video. Using fool labels brought in from local grocery stores and  markets, patients will apply the label reading guidelines and determine if the packaged food meet the Pritikin guidelines. The purpose of this lesson is to provide patients with the opportunity to review, discuss, and practice hands-on learning of the Pritikin Label Reading guidelines with actual packaged food labels. Cooking School  [employer]rit[employer]'s [employer] are designed to teach patients ways to prepare quick, simple, and affordable recipes at home. The importance of nutrition's role in chronic disease risk reduction is reflected in its emphasis in the overall Pritikin program. By learning how to prepare essential core Pritikin Eating Plan recipes, patients will increase control over what they eat; be able to customize the flavor of foods without the use of added salt, sugar, or fat; and improve the quality of the food they consume. By learning a set of core recipes which are easily assembled, quickly prepared, and affordable, patients are more likely to prepare more healthy foods at home. These workshops focus on convenient breakfasts, simple entres, side dishes, and desserts which can be prepared with minimal effort and are consistent with nutrition recommendations for cardiovascular risk reduction. Cooking [employer] are taught by a Armed forces logistics/support/administrative officer (RD) who has been trained by the [employer]. The chef or RD has a clear understanding of the importance of minimizing - if not completely eliminating -  added fat, sugar, and sodium in recipes. Throughout the series of Cooking School Workshop sessions, patients will learn about healthy ingredients and efficient methods of cooking to build confidence in their capability to prepare    Cooking School weekly topics:  Adding Flavor- Sodium-Free  Fast and Healthy Breakfasts  Powerhouse Plant-Based Proteins  Satisfying Salads and Dressings  Simple Sides and Sauces  International Cuisine-Spotlight on the [employer]  Zones  Delicious Desserts  Savory Soups  [employer] - Meals in a Astronomer Appetizers and Snacks  Comforting Weekend Breakfasts  One-Pot Wonders   Fast Evening Meals  Landscape architect Your Pritikin Plate  WORKSHOPS   Healthy Mindset (Psychosocial):  Focused Goals, Sustainable Changes Clinical staff led group instruction and group discussion with PowerPoint presentation and patient guidebook. To enhance the learning environment the use of posters, models and videos may be added. Patients will be able to apply effective goal setting strategies to establish at least one personal goal, and then take consistent, meaningful action toward that goal. They will learn to identify common barriers to achieving personal goals and develop strategies to overcome them. Patients will also gain an understanding of how our mind-set can impact our ability to achieve goals and the importance of cultivating a positive and growth-oriented mind-set. The purpose of this lesson is to provide patients with a deeper understanding of how to set and achieve personal goals, as well as the tools and strategies needed to overcome common obstacles which may arise along the way.  From Head to Heart: The Power of a Healthy Outlook  Clinical staff led group instruction and group discussion with PowerPoint presentation and patient guidebook. To enhance the learning environment the use of posters, models and videos may be added. Patients will be able to recognize and describe the impact of emotions and mood on physical health. They will discover the importance of self-care and explore self-care practices which may work for them. Patients will also learn how to utilize the 4 C's to cultivate a healthier outlook and better manage stress and challenges. The purpose of this lesson is to demonstrate to patients how a healthy outlook is an essential part of maintaining good health, especially as they continue their  cardiac rehab journey.  Healthy Sleep for a Healthy Heart Clinical staff led group instruction and group discussion with PowerPoint presentation and patient guidebook. To enhance the learning environment the use of posters, models and videos may be added. At the conclusion of this workshop, patients will be able to demonstrate knowledge of the importance of sleep to overall health, well-being, and quality of life. They will understand the symptoms of, and treatments for, common sleep disorders. Patients will also be able to identify daytime and nighttime behaviors which impact sleep, and they will be able to apply these tools to help manage sleep-related challenges. The purpose of this lesson is to provide patients with a general overview of sleep and outline the importance of quality sleep. Patients will learn about a few of the most common sleep disorders. Patients will also be introduced to the concept of "sleep hygiene," and discover ways to self-manage certain sleeping problems through simple daily behavior changes. Finally, the workshop will motivate patients by clarifying the links between quality sleep and their goals of heart-healthy living.   Recognizing and Reducing Stress Clinical staff led group instruction and group discussion with PowerPoint presentation and patient guidebook. To enhance the learning environment the use of posters, models and videos may be  added. At the conclusion of this workshop, patients will be able to understand the types of stress reactions, differentiate between acute and chronic stress, and recognize the impact that chronic stress has on their health. They will also be able to apply different coping mechanisms, such as reframing negative self-talk. Patients will have the opportunity to practice a variety of stress management techniques, such as deep abdominal breathing, progressive muscle relaxation, and/or guided imagery.  The purpose of this lesson is to educate  patients on the role of stress in their lives and to provide healthy techniques for coping with it.  Learning Barriers/Preferences:  Learning Barriers/Preferences - 02/14/23 1304       Learning Barriers/Preferences   Learning Barriers Sight;Hearing   wears glasses and hearing aids   Learning Preferences Audio;Verbal Instruction;Computer/Internet;Video;Written Material;Group Instruction;Individual Instruction;Pictoral;Skilled Demonstration             Education Topics:  Knowledge Questionnaire Score:  Knowledge Questionnaire Score - 02/14/23 1305       Knowledge Questionnaire Score   Pre Score 19/24             Core Components/Risk Factors/Patient Goals at Admission:  Personal Goals and Risk Factors at Admission - 02/14/23 1305       Core Components/Risk Factors/Patient Goals on Admission    Weight Management Yes;Obesity;Weight Loss    Intervention Weight Management: Develop a combined nutrition and exercise program designed to reach desired caloric intake, while maintaining appropriate intake of nutrient and fiber, sodium and fats, and appropriate energy expenditure required for the weight goal.;Weight Management: Provide education and appropriate resources to help participant work on and attain dietary goals.;Weight Management/Obesity: Establish reasonable short term and long term weight goals.;Obesity: Provide education and appropriate resources to help participant work on and attain dietary goals.    Admit Weight 243 lb 9.7 oz (110.5 kg)    Expected Outcomes Short Term: Continue to assess and modify interventions until short term weight is achieved;Long Term: Adherence to nutrition and physical activity/exercise program aimed toward attainment of established weight goal;Weight Loss: Understanding of general recommendations for a balanced deficit meal plan, which promotes 1-2 lb weight loss per week and includes a negative energy balance of [phone] kcal/d;Understanding  recommendations for meals to include 15-35% energy as protein, 25-35% energy from fat, 35-60% energy from carbohydrates, less than 200mg  of dietary cholesterol, 20-35 gm of total fiber daily;Understanding of distribution of calorie intake throughout the day with the consumption of 4-5 meals/snacks    Hypertension Yes    Intervention Provide education on lifestyle modifcations including regular physical activity/exercise, weight management, moderate sodium restriction and increased consumption of fresh fruit, vegetables, and low fat dairy, alcohol moderation, and smoking cessation.;Monitor prescription use compliance.    Expected Outcomes Short Term: Continued assessment and intervention until BP is < 140/79mm HG in hypertensive participants. < 130/36mm HG in hypertensive participants with diabetes, heart failure or chronic kidney disease.;Long Term: Maintenance of blood pressure at goal levels.    Lipids Yes    Intervention Provide education and support for participant on nutrition & aerobic/resistive exercise along with prescribed medications to achieve LDL 70mg , HDL >40mg .    Expected Outcomes Short Term: Participant states understanding of desired cholesterol values and is compliant with medications prescribed. Participant is following exercise prescription and nutrition guidelines.;Long Term: Cholesterol controlled with medications as prescribed, with individualized exercise RX and with personalized nutrition plan. Value goals: LDL < 70mg , HDL > 40 mg.    Stress Yes    Intervention Offer  individual and/or small group education and counseling on adjustment to heart disease, stress management and health-related lifestyle change. Teach and support self-help strategies.    Expected Outcomes Short Term: Participant demonstrates changes in health-related behavior, relaxation and other stress management skills, ability to obtain effective social support, and compliance with psychotropic medications if  prescribed.;Long Term: Emotional wellbeing is indicated by absence of clinically significant psychosocial distress or social isolation.             Core Components/Risk Factors/Patient Goals Review:   Goals and Risk Factor Review     Row Name 02/19/23 1610 03/08/23 1109 04/04/23 0910         Core Components/Risk Factors/Patient Goals Review   Personal Goals Review Weight Management/Obesity;Lipids;Hypertension;Stress Weight Management/Obesity;Lipids;Hypertension;Stress Weight Management/Obesity;Lipids;Hypertension;Stress     Review Kevin Caldwell started cardiac rehab on 02/18/23. Kevin Caldwell did well with exercise. Systolic BP's were in the lower 100's will continue to monitor BP's, asymptomatic. Kevin Caldwell has been doing well with exercise at cardiac rehab. Vital signs have been stable. Reported feeling lightheaded at home on 03/07/23. Orthostatic BP's were checked.Encouraged Kevin Caldwell to drink at least 40-60 ounces of water daily and not to fast prior to exercise at cardiac rehab. Onsite provider notifed about symptoms. Kevin Caldwell has lost 0.8 kg since starting cardiac rehab. Kevin Caldwell has been doing well with exercise at cardiac rehab. Resting systolic BP's have been in the 90's. Kevin Caldwell remains asymptomatic during exercise. Will continue to montior BP.  Kevin Caldwell has lost 3.6 kg since starting cardiac rehab.     Expected Outcomes Kevin Caldwell will conitnue to participate in cardiac rehab for exercise, nutriton and lifestyle modificaitons Kevin Caldwell will conitnue to participate in cardiac rehab for exercise, nutriton and lifestyle modificaitons Kevin Caldwell will conitnue to participate in cardiac rehab for exercise, nutriton and lifestyle modificaitons              Core Components/Risk Factors/Patient Goals at Discharge (Final Review):   Goals and Risk Factor Review - 04/04/23 0910       Core Components/Risk Factors/Patient Goals Review   Personal Goals Review Weight Management/Obesity;Lipids;Hypertension;Stress    Review Kevin Caldwell has been doing well with  exercise at cardiac rehab. Resting systolic BP's have been in the 90's. Kevin Caldwell remains asymptomatic during exercise. Will continue to montior BP.  Kevin Caldwell has lost 3.6 kg since starting cardiac rehab.    Expected Outcomes Kevin Caldwell will conitnue to participate in cardiac rehab for exercise, nutriton and lifestyle modificaitons             ITP Comments:  ITP Comments     Row Name 02/14/23 1103 02/19/23 0919 03/08/23 1106 04/04/23 0907     ITP Comments Dr. Armanda Magic medical director. Introduction to pritikin education/intensive cardiac rehab. Initial orientation packet reviewed with patient. 30 Day ITP Review, Kevin Caldwell started cardiac rehab on 02/18/23.Kevin Caldwell did well with exercise. 30 Day ITP Review, Kevin Caldwell has good attendance and participation in cardiac rehab . 30 Day ITP Review, Kevin Caldwell continues to have  good attendance and participation in cardiac rehab .             Comments: See ITP comments.Thayer Headings RN BSN

## 2023-04-05 ENCOUNTER — Encounter (HOSPITAL_COMMUNITY)
Admission: RE | Admit: 2023-04-05 | Discharge: 2023-04-05 | Disposition: A | Discharge status: Home / Self Care | Payer: Medicare Other | Source: Ambulatory Visit | Attending: Cardiovascular Disease | Admitting: Cardiovascular Disease

## 2023-04-05 DIAGNOSIS — Z955 Presence of coronary angioplasty implant and graft: Secondary | ICD-10-CM | POA: Diagnosis not present

## 2023-04-08 ENCOUNTER — Encounter (HOSPITAL_COMMUNITY)
Admission: RE | Admit: 2023-04-08 | Discharge: 2023-04-08 | Disposition: A | Discharge status: Home / Self Care | Payer: Medicare Other | Source: Ambulatory Visit | Attending: Internal Medicine | Admitting: Internal Medicine

## 2023-04-08 DIAGNOSIS — Z955 Presence of coronary angioplasty implant and graft: Secondary | ICD-10-CM | POA: Diagnosis not present

## 2023-04-08 DIAGNOSIS — Z48812 Encounter for surgical aftercare following surgery on the circulatory system: Secondary | ICD-10-CM | POA: Diagnosis not present

## 2023-04-10 ENCOUNTER — Encounter (HOSPITAL_COMMUNITY)
Admission: RE | Admit: 2023-04-10 | Discharge: 2023-04-10 | Disposition: A | Discharge status: Home / Self Care | Payer: Medicare Other | Source: Ambulatory Visit | Attending: Cardiovascular Disease | Admitting: Cardiovascular Disease

## 2023-04-10 DIAGNOSIS — Z955 Presence of coronary angioplasty implant and graft: Secondary | ICD-10-CM | POA: Diagnosis not present

## 2023-04-10 DIAGNOSIS — Z48812 Encounter for surgical aftercare following surgery on the circulatory system: Secondary | ICD-10-CM | POA: Diagnosis not present

## 2023-04-12 ENCOUNTER — Encounter (HOSPITAL_COMMUNITY)
Admission: RE | Admit: 2023-04-12 | Discharge: 2023-04-12 | Disposition: A | Discharge status: Home / Self Care | Payer: Medicare Other | Source: Ambulatory Visit | Attending: Cardiovascular Disease | Admitting: Cardiovascular Disease

## 2023-04-12 DIAGNOSIS — I82412 Acute embolism and thrombosis of left femoral vein: Secondary | ICD-10-CM | POA: Diagnosis not present

## 2023-04-12 DIAGNOSIS — Z955 Presence of coronary angioplasty implant and graft: Secondary | ICD-10-CM

## 2023-04-12 DIAGNOSIS — Z48812 Encounter for surgical aftercare following surgery on the circulatory system: Secondary | ICD-10-CM | POA: Diagnosis not present

## 2023-04-15 ENCOUNTER — Encounter (HOSPITAL_COMMUNITY): Payer: Medicare Other

## 2023-04-17 ENCOUNTER — Encounter (HOSPITAL_COMMUNITY): Payer: Medicare Other

## 2023-04-19 ENCOUNTER — Encounter (HOSPITAL_COMMUNITY)
Admission: RE | Admit: 2023-04-19 | Discharge: 2023-04-19 | Disposition: A | Discharge status: Home / Self Care | Payer: Medicare Other | Source: Ambulatory Visit | Attending: Cardiovascular Disease | Admitting: Cardiovascular Disease

## 2023-04-19 DIAGNOSIS — Z955 Presence of coronary angioplasty implant and graft: Secondary | ICD-10-CM

## 2023-04-19 DIAGNOSIS — Z48812 Encounter for surgical aftercare following surgery on the circulatory system: Secondary | ICD-10-CM | POA: Diagnosis not present

## 2023-04-22 ENCOUNTER — Encounter (HOSPITAL_COMMUNITY)
Admission: RE | Admit: 2023-04-22 | Discharge: 2023-04-22 | Disposition: A | Discharge status: Home / Self Care | Payer: Medicare Other | Source: Ambulatory Visit | Attending: Cardiovascular Disease | Admitting: Cardiovascular Disease

## 2023-04-22 DIAGNOSIS — Z48812 Encounter for surgical aftercare following surgery on the circulatory system: Secondary | ICD-10-CM | POA: Diagnosis not present

## 2023-04-22 DIAGNOSIS — Z955 Presence of coronary angioplasty implant and graft: Secondary | ICD-10-CM

## 2023-04-23 ENCOUNTER — Telehealth (HOSPITAL_COMMUNITY): Payer: Self-pay

## 2023-04-24 ENCOUNTER — Encounter (HOSPITAL_COMMUNITY)
Admission: RE | Admit: 2023-04-24 | Discharge: 2023-04-24 | Disposition: A | Discharge status: Home / Self Care | Payer: Medicare Other | Source: Ambulatory Visit | Attending: Cardiovascular Disease | Admitting: Cardiovascular Disease

## 2023-04-24 DIAGNOSIS — Z48812 Encounter for surgical aftercare following surgery on the circulatory system: Secondary | ICD-10-CM | POA: Diagnosis not present

## 2023-04-24 DIAGNOSIS — Z955 Presence of coronary angioplasty implant and graft: Secondary | ICD-10-CM

## 2023-04-26 ENCOUNTER — Encounter (HOSPITAL_COMMUNITY)
Admission: RE | Admit: 2023-04-26 | Discharge: 2023-04-26 | Disposition: A | Discharge status: Home / Self Care | Payer: Medicare Other | Source: Ambulatory Visit | Attending: Cardiovascular Disease | Admitting: Cardiovascular Disease

## 2023-04-26 DIAGNOSIS — Z48812 Encounter for surgical aftercare following surgery on the circulatory system: Secondary | ICD-10-CM | POA: Diagnosis not present

## 2023-04-26 DIAGNOSIS — Z955 Presence of coronary angioplasty implant and graft: Secondary | ICD-10-CM | POA: Diagnosis not present

## 2023-04-29 ENCOUNTER — Encounter (HOSPITAL_COMMUNITY)
Admission: RE | Admit: 2023-04-29 | Discharge: 2023-04-29 | Disposition: A | Discharge status: Home / Self Care | Payer: Medicare Other | Source: Ambulatory Visit | Attending: Cardiovascular Disease | Admitting: Cardiovascular Disease

## 2023-04-29 DIAGNOSIS — Z955 Presence of coronary angioplasty implant and graft: Secondary | ICD-10-CM

## 2023-04-29 DIAGNOSIS — Z48812 Encounter for surgical aftercare following surgery on the circulatory system: Secondary | ICD-10-CM | POA: Diagnosis not present

## 2023-05-01 ENCOUNTER — Encounter (HOSPITAL_COMMUNITY)
Admission: RE | Admit: 2023-05-01 | Discharge: 2023-05-01 | Disposition: A | Discharge status: Home / Self Care | Payer: Medicare Other | Source: Ambulatory Visit | Attending: Cardiovascular Disease | Admitting: Cardiovascular Disease

## 2023-05-01 DIAGNOSIS — Z48812 Encounter for surgical aftercare following surgery on the circulatory system: Secondary | ICD-10-CM | POA: Diagnosis not present

## 2023-05-01 DIAGNOSIS — Z955 Presence of coronary angioplasty implant and graft: Secondary | ICD-10-CM

## 2023-05-01 NOTE — Progress Notes (Signed)
 Resting systolic BP 94/60. Patient asymptomatic. Recheck sitting BP 104/70 sitting. Standing BP 118/78. Medications reviewed. Nadine Counts says that his primary care physician Dr Orson Aloe discontinued his amlodipine and stopped the hydrochlorothiazide in his Diovan. Medication list reviewed and updated. Will forward today blood pressure reading to Dr Elease Hashimoto for review. Nadine Counts is concerned that his systolic BP's are running too low. Weight today 104 kg . Nadine Counts has lost 6.5 kg since starting the program.Alexsia Klindt Mila Palmer RN BSN

## 2023-05-03 ENCOUNTER — Encounter (HOSPITAL_COMMUNITY)
Admission: RE | Admit: 2023-05-03 | Discharge: 2023-05-03 | Disposition: A | Discharge status: Home / Self Care | Payer: Medicare Other | Source: Ambulatory Visit | Attending: Cardiovascular Disease | Admitting: Cardiovascular Disease

## 2023-05-03 ENCOUNTER — Telehealth: Payer: Self-pay

## 2023-05-03 DIAGNOSIS — Z48812 Encounter for surgical aftercare following surgery on the circulatory system: Secondary | ICD-10-CM | POA: Diagnosis not present

## 2023-05-03 DIAGNOSIS — Z955 Presence of coronary angioplasty implant and graft: Secondary | ICD-10-CM

## 2023-05-03 MED ORDER — VALSARTAN 80 MG PO TABS: 80.0000 mg | ORAL_TABLET | Freq: Every day | ORAL | 3 refills | Status: DC

## 2023-05-03 NOTE — Telephone Encounter (Signed)
 Called and spoke with patient and reviewed to decrease Valsartan 80mg  daily. He verbalized understanding and medication sent to pharmacy at this time.

## 2023-05-03 NOTE — Telephone Encounter (Signed)
-----   Message from Kristeen Miss sent at 05/03/2023  8:34 AM EST ----- Please reduce [name]'s valsartan to 80 mg a day  Have him continue to monitor  and record  his BP

## 2023-05-06 ENCOUNTER — Encounter (HOSPITAL_COMMUNITY)
Admission: RE | Admit: 2023-05-06 | Discharge: 2023-05-06 | Disposition: A | Discharge status: Home / Self Care | Payer: Medicare Other | Source: Ambulatory Visit | Attending: Cardiovascular Disease | Admitting: Cardiovascular Disease

## 2023-05-06 DIAGNOSIS — I10 Essential (primary) hypertension: Secondary | ICD-10-CM | POA: Diagnosis not present

## 2023-05-06 DIAGNOSIS — Z955 Presence of coronary angioplasty implant and graft: Secondary | ICD-10-CM | POA: Diagnosis not present

## 2023-05-06 DIAGNOSIS — Z86718 Personal history of other venous thrombosis and embolism: Secondary | ICD-10-CM | POA: Insufficient documentation

## 2023-05-06 DIAGNOSIS — Z7901 Long term (current) use of anticoagulants: Secondary | ICD-10-CM | POA: Diagnosis not present

## 2023-05-06 DIAGNOSIS — I2510 Atherosclerotic heart disease of native coronary artery without angina pectoris: Secondary | ICD-10-CM | POA: Insufficient documentation

## 2023-05-06 DIAGNOSIS — Z823 Family history of stroke: Secondary | ICD-10-CM | POA: Diagnosis not present

## 2023-05-06 DIAGNOSIS — E785 Hyperlipidemia, unspecified: Secondary | ICD-10-CM | POA: Diagnosis not present

## 2023-05-06 DIAGNOSIS — Z8249 Family history of ischemic heart disease and other diseases of the circulatory system: Secondary | ICD-10-CM | POA: Diagnosis not present

## 2023-05-06 DIAGNOSIS — I447 Left bundle-branch block, unspecified: Secondary | ICD-10-CM | POA: Diagnosis not present

## 2023-05-06 DIAGNOSIS — Z7182 Exercise counseling: Secondary | ICD-10-CM | POA: Diagnosis not present

## 2023-05-06 DIAGNOSIS — R079 Chest pain, unspecified: Secondary | ICD-10-CM | POA: Insufficient documentation

## 2023-05-07 NOTE — Progress Notes (Signed)
 Cardiac Individual Treatment Plan  Patient Details  Name: Kevin Caldwell. MRN: [id number] Date of Birth: 1945-04-20 Referring Provider:   Flowsheet Row INTENSIVE CARDIAC REHAB ORIENT from 02/14/2023 in [facility]  Referring Provider Kristeen Miss, MD       Initial Encounter Date:  Flowsheet Row INTENSIVE CARDIAC REHAB ORIENT from 02/14/2023 in [facility]  Date 02/14/23       Visit Diagnosis: 01/28/23 S/P coronary artery stent placement  Patient's Home Medications on Admission:  Current Outpatient Medications:    apixaban (ELIQUIS) 5 MG TABS tablet, Take 1 tablet (5 mg total) by mouth 2 (two) times daily., Disp: 180 tablet, Rfl: 1   atorvastatin (LIPITOR) 40 MG tablet, Take 1 tablet (40 mg total) by mouth daily., Disp: 90 tablet, Rfl: 1   cholecalciferol (VITAMIN D) 1000 units tablet, Take 1,000 Units by mouth daily., Disp: , Rfl:    clopidogrel (PLAVIX) 75 MG tablet, Take 1 tablet (75 mg total) by mouth daily with breakfast., Disp: 90 tablet, Rfl: 1   nitroGLYCERIN (NITROSTAT) 0.4 MG SL tablet, Place 1 tablet (0.4 mg total) under the tongue every 5 (five) minutes as needed for chest pain., Disp: 25 tablet, Rfl: PRN   valsartan (DIOVAN) 80 MG tablet, Take 1 tablet (80 mg total) by mouth daily., Disp: 90 tablet, Rfl: 3  Past Medical History: Past Medical History:  Diagnosis Date   DVT (deep venous thrombosis) (HCC)    Hypertension    Renal disorder     Tobacco Use: Social History   Tobacco Use  Smoking Status Never  Smokeless Tobacco Never    Labs: Review Flowsheet       Latest Ref Rng & Units 07/21/2008 03/27/2023  Labs for ITP Cardiac and Pulmonary Rehab  Cholestrol 100 - 199 mg/dL 440  347   LDL (calc) 0 - 99 mg/dL 425  53   HDL-C >95 mg/dL 63.87  32   Trlycerides 0 - 149 mg/dL 56.4  332     Capillary Blood Glucose: No results found for:  "GLUCAP"   Exercise Target Goals: Exercise Program Goal: Individual exercise prescription set using results from initial 6 min walk test and THRR while considering  patient's activity barriers and safety.   Exercise Prescription Goal: Initial exercise prescription builds to 30-45 minutes a day of aerobic activity, 2-3 days per week.  Home exercise guidelines will be given to patient during program as part of exercise prescription that the participant will acknowledge.  Activity Barriers & Risk Stratification:  Activity Barriers & Cardiac Risk Stratification - 02/14/23 1257       Activity Barriers & Cardiac Risk Stratification   Activity Barriers Balance Concerns;Deconditioning;Neck/Spine Problems    Cardiac Risk Stratification High             6 Minute Walk:  6 Minute Walk     Row Name 02/14/23 1229         6 Minute Walk   Phase Initial     Distance 1440 feet     Walk Time 6 minutes     # of Rest Breaks 0     MPH 2.73     METS 2.3     RPE 9     Perceived Dyspnea  0     VO2 Peak 2.3     Symptoms No     Resting HR 69 bpm  Resting BP 103/63     Resting Oxygen Saturation  99 %     Exercise Oxygen Saturation  during 6 min walk 98 %     Max Ex. HR 100 bpm     Max Ex. BP 119/72     2 Minute Post BP 104/70              Oxygen Initial Assessment:   Oxygen Re-Evaluation:   Oxygen Discharge (Final Oxygen Re-Evaluation):   Initial Exercise Prescription:  Initial Exercise Prescription - 02/14/23 1300       Date of Initial Exercise RX and Referring Provider   Date 02/14/23    Referring Provider Kristeen Miss, MD    Expected Discharge Date 05/08/23      Bike   Level 2    Watts 25    Minutes 15    METs 2.3      Recumbant Elliptical   Level 2    RPM 50    Watts 25    Minutes 15    METs 2.3      Prescription Details   Frequency (times per week) 3    Duration Progress to 30 minutes of continuous aerobic without signs/symptoms of physical  distress      Intensity   THRR 40-80% of Max Heartrate 57-114    Ratings of Perceived Exertion 11-13    Perceived Dyspnea 0-4      Progression   Progression Continue progressive overload as per policy without signs/symptoms or physical distress.      Resistance Training   Training Prescription Yes    Weight 3 lbs    Reps 10-15             Perform Capillary Blood Glucose checks as needed.  Exercise Prescription Changes:   Exercise Prescription Changes     Row Name 02/18/23 1400 03/01/23 1600 03/22/23 1600 03/29/23 1521 04/05/23 1100     Response to Exercise   Blood Pressure (Admit) 100/56 122/68 102/58 98/68 90/60    Blood Pressure (Exercise) 108/70 138/66 -- -- --   Blood Pressure (Exit) 102/60 104/58 100/60 96/68 96/60    Heart Rate (Admit) 61 bpm 59 bpm 60 bpm 73 bpm 54 bpm   Heart Rate (Exercise) 125 bpm 127 bpm 123 bpm 130 bpm 117 bpm   Heart Rate (Exit) 77 bpm 75 bpm 80 bpm 87 bpm 78 bpm   Rating of Perceived Exertion (Exercise) 11 11 10 11 10    Symptoms None None None None None   Comments Pt's first day in the CRP2 program Reviewed METs Reviewed METs and goals Reviewed home exercise Rx Reviewed METs   Duration Continue with 30 min of aerobic exercise without signs/symptoms of physical distress. Continue with 30 min of aerobic exercise without signs/symptoms of physical distress. Continue with 30 min of aerobic exercise without signs/symptoms of physical distress. Continue with 30 min of aerobic exercise without signs/symptoms of physical distress. Continue with 30 min of aerobic exercise without signs/symptoms of physical distress.   Intensity THRR unchanged THRR unchanged THRR unchanged THRR unchanged THRR unchanged     Progression   Progression Continue to progress workloads to maintain intensity without signs/symptoms of physical distress. Continue to progress workloads to maintain intensity without signs/symptoms of physical distress. Continue to progress workloads  to maintain intensity without signs/symptoms of physical distress. Continue to progress workloads to maintain intensity without signs/symptoms of physical distress. Continue to progress workloads to maintain intensity without signs/symptoms of physical distress.   Average METs  3 3.9 3.1 3.3 3.4     Resistance Training   Training Prescription Yes Yes Yes Yes Yes   Weight 3 lbs 3 lbs 4 lbs 4 lbs 4 lbs   Reps 10-15 10-15 10-15 10-15 10-15   Time 10 Minutes 10 Minutes 10 Minutes 10 Minutes 10 Minutes     Interval Training   Interval Training No No No No No     Bike   Level 2 2 2 3  3.5   Watts 33 33 36 -- 51   Minutes 15 15 15 15 15    METs 2.8 3 3  3.4 3.4     Recumbant Elliptical   Level 2 2 3 3 4    RPM 66 58 80 66 62   Watts 98 89 36 83 101   Minutes 15 15 15 15 15    METs 3.2 3 3.2 3.2 3.4     Home Exercise Plan   Plans to continue exercise at -- -- -- Home (comment) Home (comment)   Frequency -- -- -- Add 3 additional days to program exercise sessions. Add 3 additional days to program exercise sessions.   Initial Home Exercises Provided -- -- -- 03/29/23 03/29/23    Row Name 04/24/23 1226 05/03/23 1600           Response to Exercise   Blood Pressure (Admit) 98/56 100/62      Blood Pressure (Exit) 94/56 92/54      Heart Rate (Admit) 54 bpm 62 bpm      Heart Rate (Exercise) 131 bpm 137 bpm      Heart Rate (Exit) 63 bpm 66 bpm      Rating of Perceived Exertion (Exercise) 10 10      Symptoms None None      Comments Reviewed METs and goals Reviewed METs      Duration Continue with 30 min of aerobic exercise without signs/symptoms of physical distress. Continue with 30 min of aerobic exercise without signs/symptoms of physical distress.      Intensity THRR unchanged THRR unchanged        Progression   Progression Continue to progress workloads to maintain intensity without signs/symptoms of physical distress. Continue to progress workloads to maintain intensity without  signs/symptoms of physical distress.      Average METs 3.65 4.3        Resistance Training   Training Prescription No Yes      Weight 4 lbs 5 lbs      Reps 10-15 10-15      Time 10 Minutes 10 Minutes        Interval Training   Interval Training No No        Bike   Level 5 5      Watts 85 92      Minutes 15 15      METs 3.6 4.6        Recumbant Elliptical   Level 5 7      RPM 64 62      Watts 110 107      Minutes 15 15      METs 3.7 4        Home Exercise Plan   Plans to continue exercise at Home (comment) Home (comment)      Frequency Add 3 additional days to program exercise sessions. Add 3 additional days to program exercise sessions.      Initial Home Exercises Provided 03/29/23 03/29/23  Exercise Comments:   Exercise Comments     Row Name 02/18/23 1446 03/01/23 1528 03/22/23 1630 03/29/23 1526 04/05/23 1127   Exercise Comments Pt's first day in the CRP2 program. no complaints with today's session. Pt is concerned about his blood pressure being low normal. Pt is asymptoamtic. Explained to patient we will continue to monitor during exercise. Reviewed METs with patient today. No increases in workloads as pt exceeds THRR. Will request increase in THRR in this persists. Reviewed METs and goals. Will consider increasing workloads on modalities next session. Reviewed home exercise Rx. Pt has been walking and using treadmill at home 2-3x/week in addition to the program. Pt will continue with this program at home. Pt verbalized understanding of the home exercise Rx and was provided a copy. Reviewed METs. Pt increased workloads on both modalities today.    Row Name 04/24/23 1228 05/03/23 1623         Exercise Comments Reviewed METs and goals. Pt is continues to make progress. Reviewed METs. Pt is progressing.               Exercise Goals and Review:   Exercise Goals     Row Name 02/14/23 1104             Exercise Goals   Increase Physical  Activity Yes       Intervention Provide advice, education, support and counseling about physical activity/exercise needs.;Develop an individualized exercise prescription for aerobic and resistive training based on initial evaluation findings, risk stratification, comorbidities and participant's personal goals.       Expected Outcomes Long Term: Exercising regularly at least 3-5 days a week.;Short Term: Attend rehab on a regular basis to increase amount of physical activity.;Long Term: Add in home exercise to make exercise part of routine and to increase amount of physical activity.       Increase Strength and Stamina Yes       Intervention Provide advice, education, support and counseling about physical activity/exercise needs.;Develop an individualized exercise prescription for aerobic and resistive training based on initial evaluation findings, risk stratification, comorbidities and participant's personal goals.       Expected Outcomes Short Term: Increase workloads from initial exercise prescription for resistance, speed, and METs.;Short Term: Perform resistance training exercises routinely during rehab and add in resistance training at home;Long Term: Improve cardiorespiratory fitness, muscular endurance and strength as measured by increased METs and functional capacity ( )       Able to understand and use rate of perceived exertion (RPE) scale Yes       Intervention Provide education and explanation on how to use RPE scale       Expected Outcomes Short Term: Able to use RPE daily in rehab to express subjective intensity level;Long Term:  Able to use RPE to guide intensity level when exercising independently       Knowledge and understanding of Target Heart Rate Range (THRR) Yes       Intervention Provide education and explanation of THRR including how the numbers were predicted and where they are located for reference       Expected Outcomes Short Term: Able to state/look up THRR;Short Term: Able  to use daily as guideline for intensity in rehab;Long Term: Able to use THRR to govern intensity when exercising independently       Understanding of Exercise Prescription Yes       Intervention Provide education, explanation, and written materials on patient's individual exercise prescription  Expected Outcomes Short Term: Able to explain program exercise prescription;Long Term: Able to explain home exercise prescription to exercise independently                Exercise Goals Re-Evaluation :  Exercise Goals Re-Evaluation     Row Name 02/18/23 1444 03/22/23 1628 04/24/23 1227         Exercise Goal Re-Evaluation   Exercise Goals Review Increase Physical Activity;Increase Strength and Stamina;Able to understand and use rate of perceived exertion (RPE) scale;Knowledge and understanding of Target Heart Rate Range (THRR);Understanding of Exercise Prescription Increase Physical Activity;Increase Strength and Stamina;Able to understand and use rate of perceived exertion (RPE) scale;Knowledge and understanding of Target Heart Rate Range (THRR);Understanding of Exercise Prescription Increase Physical Activity;Increase Strength and Stamina;Able to understand and use rate of perceived exertion (RPE) scale;Knowledge and understanding of Target Heart Rate Range (THRR);Understanding of Exercise Prescription     Comments Pt's first day in hte CRP2 program. Pt understnads the RPE scale, THRR and Exercise Rx. Reviewed METs and goals today. Pt voices he is making progress on his goal of improving his strength and stamina. Reviewed METs and goals today. Pt voices he is making progress on his goal of improving his strength and stamina. Pt is travleing again, made dietary charges and achieved 5.6 kg weight loss.     Expected Outcomes Will continue to monitor the patient and progress exercise workloads as tolerated. Will continue to monitor the patient and progress exercise workloads as tolerated. Will continue  to monitor the patient and progress exercise workloads as tolerated.              Discharge Exercise Prescription (Final Exercise Prescription Changes):  Exercise Prescription Changes - 05/03/23 1600       Response to Exercise   Blood Pressure (Admit) 100/62    Blood Pressure (Exit) 92/54    Heart Rate (Admit) 62 bpm    Heart Rate (Exercise) 137 bpm    Heart Rate (Exit) 66 bpm    Rating of Perceived Exertion (Exercise) 10    Symptoms None    Comments Reviewed METs    Duration Continue with 30 min of aerobic exercise without signs/symptoms of physical distress.    Intensity THRR unchanged      Progression   Progression Continue to progress workloads to maintain intensity without signs/symptoms of physical distress.    Average METs 4.3      Resistance Training   Training Prescription Yes    Weight 5 lbs    Reps 10-15    Time 10 Minutes      Interval Training   Interval Training No      Bike   Level 5    Watts 92    Minutes 15    METs 4.6      Recumbant Elliptical   Level 7    RPM 62    Watts 107    Minutes 15    METs 4      Home Exercise Plan   Plans to continue exercise at Home (comment)    Frequency Add 3 additional days to program exercise sessions.    Initial Home Exercises Provided 03/29/23             Nutrition:  Target Goals: Understanding of nutrition guidelines, daily intake of sodium 1500mg , cholesterol 200mg , calories 30% from fat and 7% or less from saturated fats, daily to have 5 or more servings of fruits and vegetables.  Biometrics:  Pre Biometrics -  02/14/23 1115       Pre Biometrics   Waist Circumference 44.5 inches    Hip Circumference 47 inches    Waist to Hip Ratio 0.95 %    Triceps Skinfold 20 mm    % Body Fat 33.3 %    Grip Strength 40 kg    Flexibility 9 in    Single Leg Stand 5.18 seconds              Nutrition Therapy Plan and Nutrition Goals:  Nutrition Therapy & Goals - 04/30/23 1429       Nutrition  Therapy   Diet Heart Healthy Diet    Drug/Food Interactions Statins/Certain Fruits      Personal Nutrition Goals   Nutrition Goal Patient to identify strategies for reducing cardiovascular risk by attending the [facility] education and nutrition series weekly.   goal in action.   Personal Goal #2 Patient to improve diet quality by using the plate method as a guide for meal planning to include lean protein/plant protein, fruits, vegetables, whole grains, nonfat dairy as part of a well-balanced diet.   goal in action.   Comments Goals in action. Nadine Counts has medical history of CAD, LBBB, hx DVT, HTN, hyperlipidemia, coronary artery stenosis. LDL remains at goal of <70 (53). HTN is well controlled. He is down 13.2# since starting with our program. Patient will benefit from participation in intensive cardiac rehab for nutrition, exercise, and lifestyle modification.      Intervention Plan   Intervention Prescribe, educate and counsel regarding individualized specific dietary modifications aiming towards targeted core components such as weight, hypertension, lipid management, diabetes, heart failure and other comorbidities.;Nutrition handout(s) given to patient.    Expected Outcomes Short Term Goal: Understand basic principles of dietary content, such as calories, fat, sodium, cholesterol and nutrients.;Long Term Goal: Adherence to prescribed nutrition plan.             Nutrition Assessments:  MEDIFICTS Score Key: >=70 Need to make dietary changes  40-70 Heart Healthy Diet <= 40 Therapeutic Level Cholesterol Diet   Flowsheet Row INTENSIVE CARDIAC REHAB from 02/18/2023 in [facility]  Picture Your Plate Total Score on Admission 67      Picture Your Plate Scores: <40 Unhealthy dietary pattern with much room for improvement. 41-50 Dietary pattern unlikely to meet recommendations for good health and room for improvement. 51-60 More  healthful dietary pattern, with some room for improvement.  >60 Healthy dietary pattern, although there may be some specific behaviors that could be improved.    Nutrition Goals Re-Evaluation:  Nutrition Goals Re-Evaluation     Row Name 04/30/23 1429             Goals   Current Weight 230 lb 6.1 oz (104.5 kg)       Comment LDL 53, HDL 32       Expected Outcome Goals in action. Nadine Counts has medical history of CAD, LBBB, hx DVT, HTN, hyperlipidemia, coronary artery stenosis. LDL remains at goal of <70 (53). HTN is well controlled. He is down 13.2# since starting with our program. Patient will benefit from participation in intensive cardiac rehab for nutrition, exercise, and lifestyle modification.                Nutrition Goals Re-Evaluation:  Nutrition Goals Re-Evaluation     Row Name 04/30/23 1429             Goals   Current  Weight 230 lb 6.1 oz (104.5 kg)       Comment LDL 53, HDL 32       Expected Outcome Goals in action. Nadine Counts has medical history of CAD, LBBB, hx DVT, HTN, hyperlipidemia, coronary artery stenosis. LDL remains at goal of <70 (53). HTN is well controlled. He is down 13.2# since starting with our program. Patient will benefit from participation in intensive cardiac rehab for nutrition, exercise, and lifestyle modification.                Nutrition Goals Discharge (Final Nutrition Goals Re-Evaluation):  Nutrition Goals Re-Evaluation - 04/30/23 1429       Goals   Current Weight 230 lb 6.1 oz (104.5 kg)    Comment LDL 53, HDL 32    Expected Outcome Goals in action. Nadine Counts has medical history of CAD, LBBB, hx DVT, HTN, hyperlipidemia, coronary artery stenosis. LDL remains at goal of <70 (53). HTN is well controlled. He is down 13.2# since starting with our program. Patient will benefit from participation in intensive cardiac rehab for nutrition, exercise, and lifestyle modification.             Psychosocial: Target Goals: Acknowledge presence or absence  of significant depression and/or stress, maximize coping skills, provide positive support system. Participant is able to verbalize types and ability to use techniques and skills needed for reducing stress and depression.  Initial Review & Psychosocial Screening:  Initial Psych Review & Screening - 02/14/23 1301       Initial Review   Current issues with None Identified;Current Stress Concerns    Source of Stress Concerns Occupation    Comments Voices stress level is low      Family Dynamics   Good Support System? --   Pt has spouse, son and daughter for support     Barriers   Psychosocial barriers to participate in program There are no identifiable barriers or psychosocial needs.      Screening Interventions   Interventions Encouraged to exercise             Quality of Life Scores:  Quality of Life - 02/14/23 1304       Quality of Life   Select Quality of Life      Quality of Life Scores   Health/Function Pre 29.08 %    Socioeconomic Pre 30 %    Psych/Spiritual Pre 30 %    Family Pre 30 %    GLOBAL Pre 29.63 %            Scores of 19 and below usually indicate a poorer quality of life in these areas.  A difference of  2-3 points is a clinically meaningful difference.  A difference of 2-3 points in the total score of the Quality of Life Index has been associated with significant improvement in overall quality of life, self-image, physical symptoms, and general health in studies assessing change in quality of life.  PHQ-9: Review Flowsheet       02/14/2023  Depression screen PHQ 2/9  Decreased Interest 0  Down, Depressed, Hopeless 0  PHQ - 2 Score 0  Altered sleeping 0  Tired, decreased energy 0  Change in appetite 0  Feeling bad or failure about yourself  0  Trouble concentrating 0  Moving slowly or fidgety/restless 0  Suicidal thoughts 0  PHQ-9 Score 0  Difficult doing work/chores Not difficult at all   Interpretation of Total Score  Total Score  Depression Severity:  1-4 =  Minimal depression, 5-9 = Mild depression, 10-14 = Moderate depression, 15-19 = Moderately severe depression, 20-27 = Severe depression   Psychosocial Evaluation and Intervention:   Psychosocial Re-Evaluation:  Psychosocial Re-Evaluation     Row Name 02/19/23 0920 03/08/23 1107 04/04/23 0908 05/07/23 1112       Psychosocial Re-Evaluation   Current issues with None Identified None Identified None Identified None Identified    Comments Nadine Counts did not voice any increased concerns or stressors on his first day of exercise. -- -- --    Interventions Encouraged to attend Cardiac Rehabilitation for the exercise Encouraged to attend Cardiac Rehabilitation for the exercise Encouraged to attend Cardiac Rehabilitation for the exercise Encouraged to attend Cardiac Rehabilitation for the exercise    Continue Psychosocial Services  No Follow up required No Follow up required No Follow up required No Follow up required             Psychosocial Discharge (Final Psychosocial Re-Evaluation):  Psychosocial Re-Evaluation - 05/07/23 1112       Psychosocial Re-Evaluation   Current issues with None Identified    Interventions Encouraged to attend Cardiac Rehabilitation for the exercise    Continue Psychosocial Services  No Follow up required             Vocational Rehabilitation: Provide vocational rehab assistance to qualifying candidates.   Vocational Rehab Evaluation & Intervention:  Vocational Rehab - 02/14/23 1305       Initial Vocational Rehab Evaluation & Intervention   Assessment shows need for Vocational Rehabilitation No   Pt is currently working            Education: Education Goals: Education classes will be provided on a weekly basis, covering required topics. Participant will state understanding/return demonstration of topics presented.    Education     Row Name 02/18/23 0900     Education   Cardiac Education Topics [facility]   Select  Workshops     Workshops   Educator Exercise Physiologist   Select Exercise   Exercise Workshop Exercise Basics: Building Your Action Plan   Instruction Review Code 1- Verbalizes Understanding   Class Start Time [phone]   Class Stop Time 0850   Class Time Calculation (min) 38 min    Row Name 02/20/23 1300     Education   Cardiac Education Topics [facility]   Orthoptist   Educator Nurse   Weekly Topic One-Pot Wonders   Instruction Review Code 1- Verbalizes Understanding   Class Start Time [phone]   Class Stop Time 0849   Class Time Calculation (min) 33 min    Row Name 02/22/23 0800     Education   Cardiac Education Topics [facility]   Psychologist, forensic Exercise Education   Exercise Education Move It!   Instruction Review Code 1- Verbalizes Understanding   Class Start Time 0815   Class Stop Time 0855   Class Time Calculation (min) 40 min    Row Name 02/25/23 0800     Education   Cardiac Education Topics [facility]   Select Core Videos     Core Videos   Educator Exercise Physiologist   Select General Education   General Education Hypertension and Heart Disease   Instruction Review Code 1- Verbalizes Understanding   Class Start Time [phone]   Class Stop Time 0854   Class Time Calculation (min) 38 min  Row Name 03/01/23 0900     Education   Cardiac Education Topics Pritikin   Glass blower/designer Nutrition   Nutrition Workshop Targeting Your Nutrition Priorities   Instruction Review Code 1- Verbalizes Understanding   Class Start Time 0815   Class Stop Time 0849   Class Time Calculation (min) 34 min    Row Name 03/04/23 0900     Education   Cardiac Education Topics Pritikin   Select Workshops     Workshops   Educator Exercise Physiologist   Select Psychosocial   Psychosocial Workshop Focused Goals, Sustainable Changes    Instruction Review Code 1- Verbalizes Understanding   Class Start Time 0815   Class Stop Time 0848   Class Time Calculation (min) 33 min    Row Name 03/08/23 1000     Education   Cardiac Education Topics Pritikin   Select Core Videos     Core Videos   Educator Dietitian   Select Nutrition   Nutrition Dining Out - Part 1   Instruction Review Code 1- Verbalizes Understanding   Class Start Time [phone]   Class Stop Time 0906   Class Time Calculation (min) 49 min    Row Name 03/11/23 0800     Education   Cardiac Education Topics Pritikin   Select Core Videos     Core Videos   Educator Exercise Physiologist   Select Exercise Education   Exercise Education Biomechanial Limitations   Instruction Review Code 1- Verbalizes Understanding   Class Start Time 0815   Class Stop Time 0848   Class Time Calculation (min) 33 min    Row Name 03/13/23 1100     Education   Cardiac Education Topics Pritikin   Secondary school teacher School   Educator Nurse;Respiratory Therapist   Weekly Topic Comforting Weekend Breakfasts   Instruction Review Code 1- Verbalizes Understanding   Class Start Time 0818   Class Stop Time 0858   Class Time Calculation (min) 40 min    Row Name 03/15/23 0900     Education   Cardiac Education Topics Pritikin   Select Core Videos     Core Videos   Educator Dietitian   Select Nutrition   Nutrition Vitamins and Minerals   Instruction Review Code 1- Verbalizes Understanding   Class Start Time 0815   Class Stop Time 0858   Class Time Calculation (min) 43 min    Row Name 03/18/23 0800     Education   Cardiac Education Topics Pritikin   Select Core Videos     Core Videos   Educator Exercise Physiologist   Select Exercise Education   Exercise Education Improving Performance   Instruction Review Code 1- Verbalizes Understanding   Class Start Time [phone]   Class Stop Time 0850   Class Time Calculation (min) 38 min    Row Name 03/20/23 1300      Education   Cardiac Education Topics Pritikin   Secondary school teacher School   Educator Nurse;Respiratory Therapist   Weekly Topic Fast Evening Meals   Instruction Review Code 1- Verbalizes Understanding   Class Start Time [phone]   Class Stop Time 8119   Class Time Calculation (min) 36 min    Row Name 03/22/23 0900     Education   Cardiac Education Topics Pritikin   Immunologist  Dietitian   Select Nutrition   Nutrition Workshop Fueling a Forensic psychologist   Instruction Review Code 1- Verbalizes Understanding   Class Start Time 0815   Class Stop Time 0850   Class Time Calculation (min) 35 min    Row Name 03/25/23 1100     Education   Cardiac Education Topics Pritikin   Select Workshops     Workshops   Educator Exercise Physiologist   Select Psychosocial   Psychosocial Workshop Healthy Sleep for a Healthy Heart   Instruction Review Code 1- Verbalizes Understanding   Class Start Time [phone]   Class Stop Time 0857   Class Time Calculation (min) 43 min    Row Name 03/27/23 1200     Education   Cardiac Education Topics Pritikin   Secondary school teacher School   Educator Nurse;Respiratory Therapist   Weekly Topic International Cuisine- Spotlight on the [facility] Zones   Instruction Review Code 1- Verbalizes Understanding   Class Start Time 0815   Class Stop Time 0851   Class Time Calculation (min) 36 min    Row Name 03/29/23 1300     Education   Select Core Videos     Core Videos   Educator Exercise Physiologist   Select Psychosocial   Psychosocial How Our Thoughts Can Heal Our Hearts   Instruction Review Code 1- Verbalizes Understanding   Class Start Time [phone]   Class Stop Time 0846   Class Time Calculation (min) 32 min    Row Name 04/01/23 0800     Education   Cardiac Education Topics Pritikin   Select Workshops     Workshops   Educator Nurse   Select Exercise   Exercise Workshop Managing Heart  Disease: Your Path to a Healthier Heart   Instruction Review Code 1- Verbalizes Understanding   Class Start Time [phone]   Class Stop Time 0846   Class Time Calculation (min) 35 min    Row Name 04/03/23 1000     Education   Cardiac Education Topics Pritikin   Secondary school teacher School   Educator Dietitian;Respiratory Therapist   Weekly Topic Simple Sides and Sauces   Instruction Review Code 1- Verbalizes Understanding   Class Start Time 0815   Class Stop Time 0847   Class Time Calculation (min) 32 min    Row Name 04/05/23 0800     Education   Cardiac Education Topics Pritikin   Select Core Videos     Core Videos   Educator Exercise Physiologist   Select General Education   General Education Hypertension and Heart Disease   Instruction Review Code 1- Verbalizes Understanding   Class Start Time 0815   Class Stop Time 0848   Class Time Calculation (min) 33 min    Row Name 04/08/23 0800     Education   Cardiac Education Topics Pritikin   Geographical information systems officer Psychosocial   Psychosocial Workshop From Head to Heart: The Power of a Healthy Outlook   Instruction Review Code 1- Verbalizes Understanding   Class Start Time 0815   Class Stop Time 0903   Class Time Calculation (min) 48 min    Row Name 04/10/23 0900     Education   Cardiac Education Topics Pritikin   Secondary school teacher School   Educator Respiratory Therapist   Weekly Topic Powerhouse Plant-Based Proteins  Instruction Review Code 1- Verbalizes Understanding   Class Start Time 0815   Class Stop Time 0847   Class Time Calculation (min) 32 min    Row Name 04/12/23 0900     Education   Cardiac Education Topics Pritikin   Select Core Videos     Core Videos   Educator Nurse   Select General Education   General Education Heart Disease Risk Reduction   Instruction Review Code 1- Verbalizes Understanding   Class Start  Time 0813   Class Stop Time 0846   Class Time Calculation (min) 33 min    Row Name 04/19/23 0800     Education   Cardiac Education Topics Pritikin   Select Core Videos     Core Videos   Educator Dietitian   Select Nutrition   Nutrition Overview of the Pritikin Eating Plan   Instruction Review Code 1- Verbalizes Understanding   Class Start Time 0815   Class Stop Time 0900   Class Time Calculation (min) 45 min    Row Name 04/22/23 0900     Education   Cardiac Education Topics Pritikin   Select Core Videos     Core Videos   Educator Exercise Physiologist   Select Psychosocial   Psychosocial Healthy Minds, Bodies, Hearts   Instruction Review Code 1- Verbalizes Understanding   Class Start Time 0813   Class Stop Time 0847   Class Time Calculation (min) 34 min    Row Name 04/24/23 0900     Education   Cardiac Education Topics Pritikin   Secondary school teacher School   Educator Respiratory Therapist;Nurse   Weekly Topic Fast and Healthy Breakfasts   Instruction Review Code 1- Verbalizes Understanding   Class Start Time [phone]   Class Stop Time 0844   Class Time Calculation (min) 32 min    Row Name 04/26/23 0900     Education   Cardiac Education Topics Pritikin   Select Core Videos     Core Videos   Educator Dietitian   Select Nutrition   Nutrition Other  Label Reading   Class Start Time 0815   Class Stop Time 0853   Class Time Calculation (min) 38 min    Row Name 04/29/23 0900     Education   Cardiac Education Topics Pritikin   Select Core Videos     Core Videos   Educator Nurse   Select Nutrition   Nutrition Becoming a Pritikin Chef   Instruction Review Code 1- Verbalizes Understanding   Class Start Time 0815   Class Stop Time 0856   Class Time Calculation (min) 41 min    Row Name 05/01/23 0900     Education   Cardiac Education Topics Pritikin   Secondary school teacher School   Educator Dietitian;Respiratory Therapist    Weekly Topic Personalizing Your Pritikin Plate   Instruction Review Code 1- Verbalizes Understanding   Class Start Time 0815   Class Stop Time 0848   Class Time Calculation (min) 33 min    Row Name 05/03/23 1000     Education   Cardiac Education Topics Pritikin   Glass blower/designer Nutrition   Nutrition Workshop Label Reading   Instruction Review Code 1- Verbalizes Understanding   Class Start Time 0815   Class Stop Time 0900   Class Time Calculation (min) 45 min    Row Name 05/06/23  1000     Education   Cardiac Education Topics Pritikin   Geographical information systems officer Psychosocial   Psychosocial Workshop Recognizing and Reducing Stress   Instruction Review Code 1- Verbalizes Understanding   Class Start Time 0815   Class Stop Time [phone]   Class Time Calculation (min) 43 min            Core Videos: Exercise    Move It!  Clinical staff conducted group or individual video education with verbal and written material and guidebook.  Patient learns the recommended Pritikin exercise program. Exercise with the goal of living a long, healthy life. Some of the health benefits of exercise include controlled diabetes, healthier blood pressure levels, improved cholesterol levels, improved heart and lung capacity, improved sleep, and better body composition. Everyone should speak with their doctor before starting or changing an exercise routine.  Biomechanical Limitations Clinical staff conducted group or individual video education with verbal and written material and guidebook.  Patient learns how biomechanical limitations can impact exercise and how we can mitigate and possibly overcome limitations to have an impactful and balanced exercise routine.  Body Composition Clinical staff conducted group or individual video education with verbal and written material and guidebook.  Patient learns that  body composition (ratio of muscle mass to fat mass) is a key component to assessing overall fitness, rather than body weight alone. Increased fat mass, especially visceral belly fat, can put [place] at increased risk for metabolic syndrome, type 2 diabetes, heart disease, and even death. It is recommended to combine diet and exercise (cardiovascular and resistance training) to improve your body composition. Seek guidance from your physician and exercise physiologist before implementing an exercise routine.  Exercise Action Plan Clinical staff conducted group or individual video education with verbal and written material and guidebook.  Patient learns the recommended strategies to achieve and enjoy long-term exercise adherence, including variety, self-motivation, self-efficacy, and positive decision making. Benefits of exercise include fitness, good health, weight management, more energy, better sleep, less stress, and overall well-being.  Medical   Heart Disease Risk Reduction Clinical staff conducted group or individual video education with verbal and written material and guidebook.  Patient learns our heart is our most vital organ as it circulates oxygen, nutrients, white blood cells, and hormones throughout the entire body, and carries waste away. Data supports a plant-based eating plan like the Pritikin Program for its effectiveness in slowing progression of and reversing heart disease. The video provides a number of recommendations to address heart disease.   Metabolic Syndrome and Belly Fat  Clinical staff conducted group or individual video education with verbal and written material and guidebook.  Patient learns what metabolic syndrome is, how it leads to heart disease, and how one can reverse it and keep it from coming back. You have metabolic syndrome if you have 3 of the following 5 criteria: abdominal obesity, high blood pressure, high triglycerides, low HDL cholesterol, and high blood  sugar.  Hypertension and Heart Disease Clinical staff conducted group or individual video education with verbal and written material and guidebook.  Patient learns that high blood pressure, or hypertension, is very common in the [place]. Hypertension is largely due to excessive salt intake, but other important risk factors include being overweight, physical inactivity, drinking too much alcohol, smoking, and not eating enough potassium from fruits and vegetables. High blood pressure is a leading risk factor for heart attack, stroke,  congestive heart failure, dementia, kidney failure, and premature death. Long-term effects of excessive salt intake include stiffening of the arteries and thickening of heart muscle and organ damage. Recommendations include ways to reduce hypertension and the risk of heart disease.  Diseases of Our Time - Focusing on Diabetes Clinical staff conducted group or individual video education with verbal and written material and guidebook.  Patient learns why the best way to stop diseases of our time is prevention, through food and other lifestyle changes. Medicine (such as prescription pills and surgeries) is often only a Band-Aid on the problem, not a long-term solution. Most common diseases of our time include obesity, type 2 diabetes, hypertension, heart disease, and cancer. The Pritikin Program is recommended and has been proven to help reduce, reverse, and/or prevent the damaging effects of metabolic syndrome.  Nutrition   Overview of the Pritikin Eating Plan  Clinical staff conducted group or individual video education with verbal and written material and guidebook.  Patient learns about the Pritikin Eating Plan for disease risk reduction. The Pritikin Eating Plan emphasizes a wide variety of unrefined, minimally-processed carbohydrates, like fruits, vegetables, whole grains, and legumes. Go, Caution, and Stop food choices are explained. Plant-based and lean animal  proteins are emphasized. Rationale provided for low sodium intake for blood pressure control, low added sugars for blood sugar stabilization, and low added fats and oils for coronary artery disease risk reduction and weight management.  Calorie Density  Clinical staff conducted group or individual video education with verbal and written material and guidebook.  Patient learns about calorie density and how it impacts the Pritikin Eating Plan. Knowing the characteristics of the food you choose will help you decide whether those foods will lead to weight gain or weight loss, and whether you want to consume more or less of them. Weight loss is usually a side effect of the Pritikin Eating Plan because of its focus on low calorie-dense foods.  Label Reading  Clinical staff conducted group or individual video education with verbal and written material and guidebook.  Patient learns about the Pritikin recommended label reading guidelines and corresponding recommendations regarding calorie density, added sugars, sodium content, and whole grains.  Dining Out - Part 1  Clinical staff conducted group or individual video education with verbal and written material and guidebook.  Patient learns that restaurant meals can be sabotaging because they can be so high in calories, fat, sodium, and/or sugar. Patient learns recommended strategies on how to positively address this and avoid unhealthy pitfalls.  Facts on Fats  Clinical staff conducted group or individual video education with verbal and written material and guidebook.  Patient learns that lifestyle modifications can be just as effective, if not more so, as many medications for lowering your risk of heart disease. A Pritikin lifestyle can help to reduce your risk of inflammation and atherosclerosis (cholesterol build-up, or plaque, in the artery walls). Lifestyle interventions such as dietary choices and physical activity address the cause of atherosclerosis.  A review of the types of fats and their impact on blood cholesterol levels, along with dietary recommendations to reduce fat intake is also included.  Nutrition Action Plan  Clinical staff conducted group or individual video education with verbal and written material and guidebook.  Patient learns how to incorporate Pritikin recommendations into their lifestyle. Recommendations include planning and keeping personal health goals in mind as an important part of their success.  Healthy Mind-Set    Healthy Minds, Bodies, Hearts  Clinical staff conducted  group or individual video education with verbal and written material and guidebook.  Patient learns how to identify when they are stressed. Video will discuss the impact of that stress, as well as the many benefits of stress management. Patient will also be introduced to stress management techniques. The way we think, act, and feel has an impact on our hearts.  How Our Thoughts Can Heal Our Hearts  Clinical staff conducted group or individual video education with verbal and written material and guidebook.  Patient learns that negative thoughts can cause depression and anxiety. This can result in negative lifestyle behavior and serious health problems. Cognitive behavioral therapy is an effective method to help control our thoughts in order to change and improve our emotional outlook.  Additional Videos:  Exercise    Improving Performance  Clinical staff conducted group or individual video education with verbal and written material and guidebook.  Patient learns to use a non-linear approach by alternating intensity levels and lengths of time spent exercising to help burn more calories and lose more body fat. Cardiovascular exercise helps improve heart health, metabolism, hormonal balance, blood sugar control, and recovery from fatigue. Resistance training improves strength, endurance, balance, coordination, reaction time, metabolism, and muscle mass.  Flexibility exercise improves circulation, posture, and balance. Seek guidance from your physician and exercise physiologist before implementing an exercise routine and learn your capabilities and proper form for all exercise.  Introduction to Yoga  Clinical staff conducted group or individual video education with verbal and written material and guidebook.  Patient learns about yoga, a discipline of the coming together of mind, breath, and body. The benefits of yoga include improved flexibility, improved range of motion, better posture and core strength, increased lung function, weight loss, and positive self-image. Yoga's heart health benefits include lowered blood pressure, healthier heart rate, decreased cholesterol and triglyceride levels, improved immune function, and reduced stress. Seek guidance from your physician and exercise physiologist before implementing an exercise routine and learn your capabilities and proper form for all exercise.  Medical   Aging: Enhancing Your Quality of Life  Clinical staff conducted group or individual video education with verbal and written material and guidebook.  Patient learns key strategies and recommendations to stay in good physical health and enhance quality of life, such as prevention strategies, having an advocate, securing a Health Care Proxy and Power of Attorney, and keeping a list of medications and system for tracking them. It also discusses how to avoid risk for bone loss.  Biology of Weight Control  Clinical staff conducted group or individual video education with verbal and written material and guidebook.  Patient learns that weight gain occurs because we consume more calories than we burn (eating more, moving less). Even if your body weight is normal, you may have higher ratios of fat compared to muscle mass. Too much body fat puts you at increased risk for cardiovascular disease, heart attack, stroke, type 2 diabetes, and obesity-related  cancers. In addition to exercise, following the Pritikin Eating Plan can help reduce your risk.  Decoding Lab Results  Clinical staff conducted group or individual video education with verbal and written material and guidebook.  Patient learns that lab test reflects one measurement whose values change over time and are influenced by many factors, including medication, stress, sleep, exercise, food, hydration, pre-existing medical conditions, and more. It is recommended to use the knowledge from this video to become more involved with your lab results and evaluate your numbers to speak with your  doctor.   Diseases of Our Time - Overview  Clinical staff conducted group or individual video education with verbal and written material and guidebook.  Patient learns that according to the CDC, 50% to 70% of chronic diseases (such as obesity, type 2 diabetes, elevated lipids, hypertension, and heart disease) are avoidable through lifestyle improvements including healthier food choices, listening to satiety cues, and increased physical activity.  Sleep Disorders Clinical staff conducted group or individual video education with verbal and written material and guidebook.  Patient learns how good quality and duration of sleep are important to overall health and well-being. Patient also learns about sleep disorders and how they impact health along with recommendations to address them, including discussing with a physician.  Nutrition  Dining Out - Part 2 Clinical staff conducted group or individual video education with verbal and written material and guidebook.  Patient learns how to plan ahead and communicate in order to maximize their dining experience in a healthy and nutritious manner. Included are recommended food choices based on the type of restaurant the patient is visiting.   Fueling a Banker conducted group or individual video education with verbal and written material and  guidebook.  There is a strong connection between our food choices and our health. Diseases like obesity and type 2 diabetes are very prevalent and are in large-part due to lifestyle choices. The Pritikin Eating Plan provides plenty of food and hunger-curbing satisfaction. It is easy to follow, affordable, and helps reduce health risks.  Menu Workshop  Clinical staff conducted group or individual video education with verbal and written material and guidebook.  Patient learns that restaurant meals can sabotage health goals because they are often packed with calories, fat, sodium, and sugar. Recommendations include strategies to plan ahead and to communicate with the manager, chef, or server to help order a healthier meal.  Planning Your Eating Strategy  Clinical staff conducted group or individual video education with verbal and written material and guidebook.  Patient learns about the Pritikin Eating Plan and its benefit of reducing the risk of disease. The Pritikin Eating Plan does not focus on calories. Instead, it emphasizes high-quality, nutrient-rich foods. By knowing the characteristics of the foods, we choose, we can determine their calorie density and make informed decisions.  Targeting Your Nutrition Priorities  Clinical staff conducted group or individual video education with verbal and written material and guidebook.  Patient learns that lifestyle habits have a tremendous impact on disease risk and progression. This video provides eating and physical activity recommendations based on your personal health goals, such as reducing LDL cholesterol, losing weight, preventing or controlling type 2 diabetes, and reducing high blood pressure.  Vitamins and Minerals  Clinical staff conducted group or individual video education with verbal and written material and guidebook.  Patient learns different ways to obtain key vitamins and minerals, including through a recommended healthy diet. It is  important to discuss all supplements you take with your doctor.   Healthy Mind-Set    Smoking Cessation  Clinical staff conducted group or individual video education with verbal and written material and guidebook.  Patient learns that cigarette smoking and tobacco addiction pose a serious health risk which affects millions of people. Stopping smoking will significantly reduce the risk of heart disease, lung disease, and many forms of cancer. Recommended strategies for quitting are covered, including working with your doctor to develop a successful plan.  Culinary   Becoming a Set designer  conducted group or individual video education with verbal and written material and guidebook.  Patient learns that cooking at home can be healthy, cost-effective, quick, and puts them in control. Keys to cooking healthy recipes will include looking at your recipe, assessing your equipment needs, planning ahead, making it simple, choosing cost-effective seasonal ingredients, and limiting the use of added fats, salts, and sugars.  Cooking - Breakfast and Snacks  Clinical staff conducted group or individual video education with verbal and written material and guidebook.  Patient learns how important breakfast is to satiety and nutrition through the entire day. Recommendations include key foods to eat during breakfast to help stabilize blood sugar levels and to prevent overeating at meals later in the day. Planning ahead is also a key component.  Cooking - Educational psychologist conducted group or individual video education with verbal and written material and guidebook.  Patient learns eating strategies to improve overall health, including an approach to cook more at home. Recommendations include thinking of animal protein as a side on your plate rather than center stage and focusing instead on lower calorie dense options like vegetables, fruits, whole grains, and plant-based proteins,  such as beans. Making sauces in large quantities to freeze for later and leaving the skin on your vegetables are also recommended to maximize your experience.  Cooking - Healthy Salads and Dressing Clinical staff conducted group or individual video education with verbal and written material and guidebook.  Patient learns that vegetables, fruits, whole grains, and legumes are the foundations of the Pritikin Eating Plan. Recommendations include how to incorporate each of these in flavorful and healthy salads, and how to create homemade salad dressings. Proper handling of ingredients is also covered. Cooking - Soups and [employer] - Soups and Desserts Clinical staff conducted group or individual video education with verbal and written material and guidebook.  Patient learns that Pritikin soups and desserts make for easy, nutritious, and delicious snacks and meal components that are low in sodium, fat, sugar, and calorie density, while high in vitamins, minerals, and filling fiber. Recommendations include simple and healthy ideas for soups and desserts.   Overview     The Pritikin Solution Program Overview Clinical staff conducted group or individual video education with verbal and written material and guidebook.  Patient learns that the results of the Pritikin Program have been documented in more than 100 articles published in peer-reviewed journals, and the benefits include reducing risk factors for (and, in some cases, even reversing) high cholesterol, high blood pressure, type 2 diabetes, obesity, and more! An overview of the three key pillars of the Pritikin Program will be covered: eating well, doing regular exercise, and having a healthy mind-set.  WORKSHOPS  Exercise: Exercise Basics: Building Your Action Plan Clinical staff led group instruction and group discussion with PowerPoint presentation and patient guidebook. To enhance the learning environment the use of posters, models and  videos may be added. At the conclusion of this workshop, patients will comprehend the difference between physical activity and exercise, as well as the benefits of incorporating both, into their routine. Patients will understand the FITT (Frequency, Intensity, Time, and Type) principle and how to use it to build an exercise action plan. In addition, safety concerns and other considerations for exercise and cardiac rehab will be addressed by the presenter. The purpose of this lesson is to promote a comprehensive and effective weekly exercise routine in order to improve patients' overall level of fitness.  Managing Heart Disease: Your Path to a Healthier Heart Clinical staff led group instruction and group discussion with PowerPoint presentation and patient guidebook. To enhance the learning environment the use of posters, models and videos may be added.At the conclusion of this workshop, patients will understand the anatomy and physiology of the heart. Additionally, they will understand how Pritikin's three pillars impact the risk factors, the progression, and the management of heart disease.  The purpose of this lesson is to provide a high-level overview of the heart, heart disease, and how the Pritikin lifestyle positively impacts risk factors.  Exercise Biomechanics Clinical staff led group instruction and group discussion with PowerPoint presentation and patient guidebook. To enhance the learning environment the use of posters, models and videos may be added. Patients will learn how the structural parts of their bodies function and how these functions impact their daily activities, movement, and exercise. Patients will learn how to promote a neutral spine, learn how to manage pain, and identify ways to improve their physical movement in order to promote healthy living. The purpose of this lesson is to expose patients to common physical limitations that impact physical activity. Participants  will learn practical ways to adapt and manage aches and pains, and to minimize their effect on regular exercise. Patients will learn how to maintain good posture while sitting, walking, and lifting.  Balance Training and Fall Prevention  Clinical staff led group instruction and group discussion with PowerPoint presentation and patient guidebook. To enhance the learning environment the use of posters, models and videos may be added. At the conclusion of this workshop, patients will understand the importance of their sensorimotor skills (vision, proprioception, and the vestibular system) in maintaining their ability to balance as they age. Patients will apply a variety of balancing exercises that are appropriate for their current level of function. Patients will understand the common causes for poor balance, possible solutions to these problems, and ways to modify their physical environment in order to minimize their fall risk. The purpose of this lesson is to teach patients about the importance of maintaining balance as they age and ways to minimize their risk of falling.  WORKSHOPS   Nutrition:  Fueling a Ship broker led group instruction and group discussion with PowerPoint presentation and patient guidebook. To enhance the learning environment the use of posters, models and videos may be added. Patients will review the foundational principles of the Pritikin Eating Plan and understand what constitutes a serving size in each of the food groups. Patients will also learn Pritikin-friendly foods that are better choices when away from home and review make-ahead meal and snack options. Calorie density will be reviewed and applied to three nutrition priorities: weight maintenance, weight loss, and weight gain. The purpose of this lesson is to reinforce (in a group setting) the key concepts around what patients are recommended to eat and how to apply these guidelines when away from home by  planning and selecting Pritikin-friendly options. Patients will understand how calorie density may be adjusted for different weight management goals.  Mindful Eating  Clinical staff led group instruction and group discussion with PowerPoint presentation and patient guidebook. To enhance the learning environment the use of posters, models and videos may be added. Patients will briefly review the concepts of the Pritikin Eating Plan and the importance of low-calorie dense foods. The concept of mindful eating will be introduced as well as the importance of paying attention to internal hunger signals. Triggers for non-hunger eating and  techniques for dealing with triggers will be explored. The purpose of this lesson is to provide patients with the opportunity to review the basic principles of the Pritikin Eating Plan, discuss the value of eating mindfully and how to measure internal cues of hunger and fullness using the Hunger Scale. Patients will also discuss reasons for non-hunger eating and learn strategies to use for controlling emotional eating.  Targeting Your Nutrition Priorities Clinical staff led group instruction and group discussion with PowerPoint presentation and patient guidebook. To enhance the learning environment the use of posters, models and videos may be added. Patients will learn how to determine their genetic susceptibility to disease by reviewing their family history. Patients will gain insight into the importance of diet as part of an overall healthy lifestyle in mitigating the impact of genetics and other environmental insults. The purpose of this lesson is to provide patients with the opportunity to assess their personal nutrition priorities by looking at their family history, their own health history and current risk factors. Patients will also be able to discuss ways of prioritizing and modifying the Pritikin Eating Plan for their highest risk areas  Menu  Clinical staff led group  instruction and group discussion with PowerPoint presentation and patient guidebook. To enhance the learning environment the use of posters, models and videos may be added. Using menus brought in from [employer], or printed from [employer], patients will apply the Pritikin dining out guidelines that were presented in the [employer] video. Patients will also be able to practice these guidelines in a variety of provided scenarios. The purpose of this lesson is to provide patients with the opportunity to practice hands-on learning of the Pritikin Dining Out guidelines with actual menus and practice scenarios.  Label Reading Clinical staff led group instruction and group discussion with PowerPoint presentation and patient guidebook. To enhance the learning environment the use of posters, models and videos may be added. Patients will review and discuss the Pritikin label reading guidelines presented in Pritikin's Label Reading Educational series video. Using fool labels brought in from local grocery stores and markets, patients will apply the label reading guidelines and determine if the packaged food meet the Pritikin guidelines. The purpose of this lesson is to provide patients with the opportunity to review, discuss, and practice hands-on learning of the Pritikin Label Reading guidelines with actual packaged food labels. Cooking School  Pritikin's [employer] are designed to teach patients ways to prepare quick, simple, and affordable recipes at home. The importance of nutrition's role in chronic disease risk reduction is reflected in its emphasis in the overall Pritikin program. By learning how to prepare essential core Pritikin Eating Plan recipes, patients will increase control over what they eat; be able to customize the flavor of foods without the use of added salt, sugar, or fat; and improve the quality of the food they consume. By learning a set of core recipes  which are easily assembled, quickly prepared, and affordable, patients are more likely to prepare more healthy foods at home. These workshops focus on convenient breakfasts, simple entres, side dishes, and desserts which can be prepared with minimal effort and are consistent with nutrition recommendations for cardiovascular risk reduction. Cooking [employer] are taught by a Armed forces logistics/support/administrative officer (RD) who has been trained by the [employer]. The chef or RD has a clear understanding of the importance of minimizing - if not completely eliminating - added fat, sugar, and sodium in  recipes. Throughout the series of Cooking School Workshop sessions, patients will learn about healthy ingredients and efficient methods of cooking to build confidence in their capability to prepare    Cooking School weekly topics:  Adding Flavor- Sodium-Free  Fast and Healthy Breakfasts  Powerhouse Plant-Based Proteins  Satisfying Salads and Dressings  Simple Sides and Sauces  International Cuisine-Spotlight on the [employer] Zones  Delicious Desserts  Savory Soups  [employer] - Meals in a Astronomer Appetizers and Snacks  Comforting Weekend Breakfasts  One-Pot Wonders   Fast Evening Meals  Landscape architect Your Pritikin Plate  WORKSHOPS   Healthy Mindset (Psychosocial):  Focused Goals, Sustainable Changes Clinical staff led group instruction and group discussion with PowerPoint presentation and patient guidebook. To enhance the learning environment the use of posters, models and videos may be added. Patients will be able to apply effective goal setting strategies to establish at least one personal goal, and then take consistent, meaningful action toward that goal. They will learn to identify common barriers to achieving personal goals and develop strategies to overcome them. Patients will also gain an understanding of how our mind-set can impact our ability to achieve  goals and the importance of cultivating a positive and growth-oriented mind-set. The purpose of this lesson is to provide patients with a deeper understanding of how to set and achieve personal goals, as well as the tools and strategies needed to overcome common obstacles which may arise along the way.  From Head to Heart: The Power of a Healthy Outlook  Clinical staff led group instruction and group discussion with PowerPoint presentation and patient guidebook. To enhance the learning environment the use of posters, models and videos may be added. Patients will be able to recognize and describe the impact of emotions and mood on physical health. They will discover the importance of self-care and explore self-care practices which may work for them. Patients will also learn how to utilize the 4 C's to cultivate a healthier outlook and better manage stress and challenges. The purpose of this lesson is to demonstrate to patients how a healthy outlook is an essential part of maintaining good health, especially as they continue their cardiac rehab journey.  Healthy Sleep for a Healthy Heart Clinical staff led group instruction and group discussion with PowerPoint presentation and patient guidebook. To enhance the learning environment the use of posters, models and videos may be added. At the conclusion of this workshop, patients will be able to demonstrate knowledge of the importance of sleep to overall health, well-being, and quality of life. They will understand the symptoms of, and treatments for, common sleep disorders. Patients will also be able to identify daytime and nighttime behaviors which impact sleep, and they will be able to apply these tools to help manage sleep-related challenges. The purpose of this lesson is to provide patients with a general overview of sleep and outline the importance of quality sleep. Patients will learn about a few of the most common sleep disorders. Patients will also be  introduced to the concept of "sleep hygiene," and discover ways to self-manage certain sleeping problems through simple daily behavior changes. Finally, the workshop will motivate patients by clarifying the links between quality sleep and their goals of heart-healthy living.   Recognizing and Reducing Stress Clinical staff led group instruction and group discussion with PowerPoint presentation and patient guidebook. To enhance the learning environment the use of posters, models and videos may be added. At the conclusion of this  workshop, patients will be able to understand the types of stress reactions, differentiate between acute and chronic stress, and recognize the impact that chronic stress has on their health. They will also be able to apply different coping mechanisms, such as reframing negative self-talk. Patients will have the opportunity to practice a variety of stress management techniques, such as deep abdominal breathing, progressive muscle relaxation, and/or guided imagery.  The purpose of this lesson is to educate patients on the role of stress in their lives and to provide healthy techniques for coping with it.  Learning Barriers/Preferences:  Learning Barriers/Preferences - 02/14/23 1304       Learning Barriers/Preferences   Learning Barriers Sight;Hearing   wears glasses and hearing aids   Learning Preferences Audio;Verbal Instruction;Computer/Internet;Video;Written Material;Group Instruction;Individual Instruction;Pictoral;Skilled Demonstration             Education Topics:  Knowledge Questionnaire Score:  Knowledge Questionnaire Score - 02/14/23 1305       Knowledge Questionnaire Score   Pre Score 19/24             Core Components/Risk Factors/Patient Goals at Admission:  Personal Goals and Risk Factors at Admission - 02/14/23 1305       Core Components/Risk Factors/Patient Goals on Admission    Weight Management Yes;Obesity;Weight Loss    Intervention  Weight Management: Develop a combined nutrition and exercise program designed to reach desired caloric intake, while maintaining appropriate intake of nutrient and fiber, sodium and fats, and appropriate energy expenditure required for the weight goal.;Weight Management: Provide education and appropriate resources to help participant work on and attain dietary goals.;Weight Management/Obesity: Establish reasonable short term and long term weight goals.;Obesity: Provide education and appropriate resources to help participant work on and attain dietary goals.    Admit Weight 243 lb 9.7 oz (110.5 kg)    Expected Outcomes Short Term: Continue to assess and modify interventions until short term weight is achieved;Long Term: Adherence to nutrition and physical activity/exercise program aimed toward attainment of established weight goal;Weight Loss: Understanding of general recommendations for a balanced deficit meal plan, which promotes 1-2 lb weight loss per week and includes a negative energy balance of [phone] kcal/d;Understanding recommendations for meals to include 15-35% energy as protein, 25-35% energy from fat, 35-60% energy from carbohydrates, less than 200mg  of dietary cholesterol, 20-35 gm of total fiber daily;Understanding of distribution of calorie intake throughout the day with the consumption of 4-5 meals/snacks    Hypertension Yes    Intervention Provide education on lifestyle modifcations including regular physical activity/exercise, weight management, moderate sodium restriction and increased consumption of fresh fruit, vegetables, and low fat dairy, alcohol moderation, and smoking cessation.;Monitor prescription use compliance.    Expected Outcomes Short Term: Continued assessment and intervention until BP is < 140/71mm HG in hypertensive participants. < 130/70mm HG in hypertensive participants with diabetes, heart failure or chronic kidney disease.;Long Term: Maintenance of blood pressure at goal  levels.    Lipids Yes    Intervention Provide education and support for participant on nutrition & aerobic/resistive exercise along with prescribed medications to achieve LDL 70mg , HDL >40mg .    Expected Outcomes Short Term: Participant states understanding of desired cholesterol values and is compliant with medications prescribed. Participant is following exercise prescription and nutrition guidelines.;Long Term: Cholesterol controlled with medications as prescribed, with individualized exercise RX and with personalized nutrition plan. Value goals: LDL < 70mg , HDL > 40 mg.    Stress Yes    Intervention Offer individual and/or small group education and  counseling on adjustment to heart disease, stress management and health-related lifestyle change. Teach and support self-help strategies.    Expected Outcomes Short Term: Participant demonstrates changes in health-related behavior, relaxation and other stress management skills, ability to obtain effective social support, and compliance with psychotropic medications if prescribed.;Long Term: Emotional wellbeing is indicated by absence of clinically significant psychosocial distress or social isolation.             Core Components/Risk Factors/Patient Goals Review:   Goals and Risk Factor Review     Row Name 02/19/23 6295 03/08/23 1109 04/04/23 0910 05/07/23 1109       Core Components/Risk Factors/Patient Goals Review   Personal Goals Review Weight Management/Obesity;Lipids;Hypertension;Stress Weight Management/Obesity;Lipids;Hypertension;Stress Weight Management/Obesity;Lipids;Hypertension;Stress Weight Management/Obesity;Lipids;Hypertension;Stress    Review Nadine Counts started cardiac rehab on 02/18/23. Nadine Counts did well with exercise. Systolic BP's were in the lower 100's will continue to monitor BP's, asymptomatic. Nadine Counts has been doing well with exercise at cardiac rehab. Vital signs have been stable. Reported feeling lightheaded at home on 03/07/23.  Orthostatic BP's were checked.Encouraged Mr Jutras to drink at least 40-60 ounces of water daily and not to fast prior to exercise at cardiac rehab. Onsite provider notifed about symptoms. Nadine Counts has lost 0.8 kg since starting cardiac rehab. Nadine Counts has been doing well with exercise at cardiac rehab. Resting systolic BP's have been in the 90's. Nadine Counts remains asymptomatic during exercise. Will continue to montior BP.  Nadine Counts has lost 3.6 kg since starting cardiac rehab. Nadine Counts continues to do well with exercise at cardiac rehab. [name]'s valsartan was reduced to 80 mg a day by Dr Elease Hashimoto. Nadine Counts has lost 7.1 kg since starting cardiac rehab. Nadine Counts will complete cardiac rehab tenatively on 05/17/23.    Expected Outcomes Nadine Counts will conitnue to participate in cardiac rehab for exercise, nutriton and lifestyle modificaitons Nadine Counts will conitnue to participate in cardiac rehab for exercise, nutriton and lifestyle modificaitons Nadine Counts will conitnue to participate in cardiac rehab for exercise, nutriton and lifestyle modificaitons Nadine Counts will conitnue to participate in cardiac rehab for exercise, nutriton and lifestyle modificaitons             Core Components/Risk Factors/Patient Goals at Discharge (Final Review):   Goals and Risk Factor Review - 05/07/23 1109       Core Components/Risk Factors/Patient Goals Review   Personal Goals Review Weight Management/Obesity;Lipids;Hypertension;Stress    Review Nadine Counts continues to do well with exercise at cardiac rehab. [name]'s valsartan was reduced to 80 mg a day by Dr Elease Hashimoto. Nadine Counts has lost 7.1 kg since starting cardiac rehab. Nadine Counts will complete cardiac rehab tenatively on 05/17/23.    Expected Outcomes Nadine Counts will conitnue to participate in cardiac rehab for exercise, nutriton and lifestyle modificaitons             ITP Comments:  ITP Comments     Row Name 02/14/23 1103 02/19/23 0919 03/08/23 1106 04/04/23 0907 05/07/23 1106   ITP Comments Dr. Armanda Magic medical director. Introduction to pritikin  education/intensive cardiac rehab. Initial orientation packet reviewed with patient. 30 Day ITP Review, Nadine Counts started cardiac rehab on 02/18/23.Nadine Counts did well with exercise. 30 Day ITP Review, Nadine Counts has good attendance and participation in cardiac rehab . 30 Day ITP Review, Nadine Counts continues to have  good attendance and participation in cardiac rehab . 30 Day ITP Review, Nadine Counts continues to have  good attendance and participation in cardiac rehab . Nadine Counts will tenatively complete cardiac rehab on 05/17/23.  Comments: See ITP comments.Thayer Headings RN BSN

## 2023-05-08 ENCOUNTER — Encounter (HOSPITAL_COMMUNITY)
Admission: RE | Admit: 2023-05-08 | Discharge: 2023-05-08 | Disposition: A | Discharge status: Home / Self Care | Payer: Medicare Other | Source: Ambulatory Visit | Attending: Cardiovascular Disease | Admitting: Cardiovascular Disease

## 2023-05-08 DIAGNOSIS — I2510 Atherosclerotic heart disease of native coronary artery without angina pectoris: Secondary | ICD-10-CM | POA: Diagnosis not present

## 2023-05-08 DIAGNOSIS — Z7901 Long term (current) use of anticoagulants: Secondary | ICD-10-CM | POA: Diagnosis not present

## 2023-05-08 DIAGNOSIS — I447 Left bundle-branch block, unspecified: Secondary | ICD-10-CM | POA: Diagnosis not present

## 2023-05-08 DIAGNOSIS — I10 Essential (primary) hypertension: Secondary | ICD-10-CM | POA: Diagnosis not present

## 2023-05-08 DIAGNOSIS — Z955 Presence of coronary angioplasty implant and graft: Secondary | ICD-10-CM | POA: Diagnosis not present

## 2023-05-08 DIAGNOSIS — Z86718 Personal history of other venous thrombosis and embolism: Secondary | ICD-10-CM | POA: Diagnosis not present

## 2023-05-09 ENCOUNTER — Encounter: Payer: Self-pay | Admitting: Cardiovascular Disease

## 2023-05-09 NOTE — Progress Notes (Signed)
 Cardiology Office Note:  .   Date:  05/10/2023  ID:  Kevin Caldwell., DOB 1946/01/08, MRN [id number] PCP: Kevin Aspen, MD  [facility] Providers Cardiologist:  Kevin Miss, MD    History of Present Illness: .   01/27/23  Kevin Caldwell. is a 78 y.o. male with hx of left DVT, HTN,  On Eliquis for his  DVT    Seen with his son,  Kevin Caldwell .   Has had some CP ,  Was seen in the ER  Pain lasted several minutes.   Was his second episode Was dismissed from the ER    CAC score was performed - 1595  (83rd percentile)   Cardiac PET shows a fixed defect with surrounding by reversible defect   He walks regularly Spins regularly - at [employer]  Does have some pain in his shoulders - not related to exercise     Family hx   Mother had MI in 27s  Stroke at 47  Father :  CABG - in his 21s   May 10, 2023 Kevin Caldwell is seen for follow up of his CAD,   LBBB  He was having CP at our last visit Cath revealed 2 V CAD ( prox - mid LAD and prox-mid RCA) He had successful PCI to his mid LAD stenosis using a 2.75 x 12 mm DES.  He has a proximal RCA stenosis that was treated medically.  He is currently on atorvastatin 40 mg a day, Plavix 75 mg a day and Eliquis 5 mg twice a day ( for DVT )  No CP since the stent placement .  Is exercising regularly     He has a LBBB , has not had an echo performed yet Will order echo  Asks many questions.  Wanted clarification on many issues  He wants to stop some meds .  He is on eliquis for a DVT.  If his medical doctor stops the eliquis ( due to resolution of the DVT) , I would suggest that he add ASA 81 mg - at least for this first year following DVT   Walks and works out on the treadmill Wants to return to spin class   Periodically has symptoms of orthostatic hypotension Not enough to reduce his meds     ROS:    Studies Reviewed: .         Risk Assessment/Calculations:     Physical Exam:     Physical  Exam: Blood pressure (!) 126/58, pulse (!) 52, height 5' 10.5" (1.791 m), weight 227 lb 6.4 oz (103.1 kg), SpO2 99%.      GEN:  Well nourished, well developed in no acute distress HEENT: Normal NECK: No JVD; No carotid bruits LYMPHATICS: No lymphadenopathy CARDIAC: RRR , no murmurs, rubs, gallops RESPIRATORY:  Clear to auscultation without rales, wheezing or rhonchi  ABDOMEN: Soft, non-tender, non-distended MUSCULOSKELETAL:  right radial cath site ( aborted site) looks good.  Left radial ( aborted ) looks good.   RFA - no hematoma, good pulse  SKIN: Warm and dry NEUROLOGIC:  Alert and oriented x 3   ASSESSMENT AND PLAN: .   Coronary artery disease:  Kevin Caldwell was found to have two-vessel coronary artery disease at the time of heart cath.  He had an extremely tight LAD stenosis that was successfully stented.  He has a moderate to severe proximal RCA stenosis which was nonflow obstructive by flow  wire. He is not having any  symptoms of angina.  He is exercising regularly.  I have stressed the importance of regular exercise and to monitor his symptoms.  If he were to start having angina then we might need to proceed with heart catheterization again.  I hope is that he will be stable as long as his LDL remains at the current level of 53.  I encouraged him to continue to work on diet, exercise, weight loss.  2.  Hyperlipidemia: His last LDL is 53.  Continue atorvastatin  3.  Left bundle branch block: Will get an echocardiogram for assessment of his LV function.  Continue valsartan at the current dose.      Informed Consent   Shared Decision Making/Informed Consent The risks [stroke (1 in 1000), death (1 in 1000), kidney failure [usually temporary] (1 in 500), bleeding (1 in 200), allergic reaction [possibly serious] (1 in 200)], benefits (diagnostic support and management of coronary artery disease) and alternatives of a cardiac catheterization were discussed in detail with [name] and he is  willing to proceed.     Dispo: 3-4 months    Signed, Kevin Miss, MD

## 2023-05-10 ENCOUNTER — Encounter (HOSPITAL_COMMUNITY)
Admission: RE | Admit: 2023-05-10 | Discharge: 2023-05-10 | Disposition: A | Discharge status: Home / Self Care | Payer: Medicare Other | Source: Ambulatory Visit | Attending: Cardiovascular Disease | Admitting: Cardiovascular Disease

## 2023-05-10 ENCOUNTER — Ambulatory Visit (INDEPENDENT_AMBULATORY_CARE_PROVIDER_SITE_OTHER): Payer: Medicare Other | Admitting: Cardiovascular Disease

## 2023-05-10 VITALS — BP 126/58 | HR 52 | Ht 70.5 in | Wt 227.4 lb

## 2023-05-10 DIAGNOSIS — Z7901 Long term (current) use of anticoagulants: Secondary | ICD-10-CM | POA: Diagnosis not present

## 2023-05-10 DIAGNOSIS — Z955 Presence of coronary angioplasty implant and graft: Secondary | ICD-10-CM | POA: Diagnosis not present

## 2023-05-10 DIAGNOSIS — I2510 Atherosclerotic heart disease of native coronary artery without angina pectoris: Secondary | ICD-10-CM | POA: Diagnosis not present

## 2023-05-10 DIAGNOSIS — I10 Essential (primary) hypertension: Secondary | ICD-10-CM | POA: Diagnosis not present

## 2023-05-10 DIAGNOSIS — Z86718 Personal history of other venous thrombosis and embolism: Secondary | ICD-10-CM | POA: Diagnosis not present

## 2023-05-10 DIAGNOSIS — I447 Left bundle-branch block, unspecified: Secondary | ICD-10-CM | POA: Insufficient documentation

## 2023-05-10 NOTE — Patient Instructions (Signed)
 Testing/Procedures: ECHO Your physician has requested that you have an echocardiogram. Echocardiography is a painless test that uses sound waves to create images of your heart. It provides your doctor with information about the size and shape of your heart and how well your heart's chambers and valves are working. This procedure takes approximately one hour. There are no restrictions for this procedure. Please do NOT wear cologne, perfume, aftershave, or lotions (deodorant is allowed). Please arrive 15 minutes prior to your appointment time.  Please note: We ask at that you not bring children with you during ultrasound (echo/ vascular) testing. Due to room size and safety concerns, children are not allowed in the ultrasound rooms during exams. Our front office staff cannot provide observation of children in our lobby area while testing is being conducted. An adult accompanying a patient to their appointment will only be allowed in the ultrasound room at the discretion of the ultrasound technician under special circumstances. We apologize for any inconvenience.  Follow-Up: At [facility], you and your health needs are our priority.  As part of our continuing mission to provide you with exceptional heart care, we have created designated Provider Care Teams.  These Care Teams include your primary Cardiologist (physician) and Advanced Practice Providers (APPs -  Physician Assistants and Nurse Practitioners) who all work together to provide you with the care you need, when you need it.  Your next appointment:   3 month(s)  Provider:   Tessa Lerner, MD     1st Floor: - Lobby - Registration  - Pharmacy  - Lab - Cafe  2nd Floor: - PV Lab - Diagnostic Testing (echo, CT, nuclear med)  3rd Floor: - Vacant  4th Floor: - TCTS (cardiothoracic surgery) - AFib Clinic - Structural Heart Clinic - Vascular Surgery  - Vascular Ultrasound  5th Floor: - HeartCare Cardiology (general and  EP) - Clinical Pharmacy for coumadin, hypertension, lipid, weight-loss medications, and med management appointments    Valet parking services will be available as well.

## 2023-05-13 ENCOUNTER — Encounter (HOSPITAL_COMMUNITY)
Admission: RE | Admit: 2023-05-13 | Discharge: 2023-05-13 | Disposition: A | Discharge status: Home / Self Care | Payer: Medicare Other | Source: Ambulatory Visit | Attending: Cardiovascular Disease | Admitting: Cardiovascular Disease

## 2023-05-13 VITALS — Ht 70.5 in | Wt 226.6 lb

## 2023-05-13 DIAGNOSIS — Z7901 Long term (current) use of anticoagulants: Secondary | ICD-10-CM | POA: Diagnosis not present

## 2023-05-13 DIAGNOSIS — I2510 Atherosclerotic heart disease of native coronary artery without angina pectoris: Secondary | ICD-10-CM | POA: Diagnosis not present

## 2023-05-13 DIAGNOSIS — Z955 Presence of coronary angioplasty implant and graft: Secondary | ICD-10-CM | POA: Diagnosis not present

## 2023-05-13 DIAGNOSIS — I10 Essential (primary) hypertension: Secondary | ICD-10-CM | POA: Diagnosis not present

## 2023-05-13 DIAGNOSIS — I447 Left bundle-branch block, unspecified: Secondary | ICD-10-CM | POA: Diagnosis not present

## 2023-05-13 DIAGNOSIS — Z86718 Personal history of other venous thrombosis and embolism: Secondary | ICD-10-CM | POA: Diagnosis not present

## 2023-05-15 ENCOUNTER — Encounter (HOSPITAL_COMMUNITY)
Admission: RE | Admit: 2023-05-15 | Discharge: 2023-05-15 | Disposition: A | Discharge status: Home / Self Care | Payer: Medicare Other | Source: Ambulatory Visit | Attending: Cardiovascular Disease | Admitting: Cardiovascular Disease

## 2023-05-15 DIAGNOSIS — I447 Left bundle-branch block, unspecified: Secondary | ICD-10-CM | POA: Diagnosis not present

## 2023-05-15 DIAGNOSIS — Z86718 Personal history of other venous thrombosis and embolism: Secondary | ICD-10-CM | POA: Diagnosis not present

## 2023-05-15 DIAGNOSIS — Z7901 Long term (current) use of anticoagulants: Secondary | ICD-10-CM | POA: Diagnosis not present

## 2023-05-15 DIAGNOSIS — Z955 Presence of coronary angioplasty implant and graft: Secondary | ICD-10-CM

## 2023-05-15 DIAGNOSIS — I10 Essential (primary) hypertension: Secondary | ICD-10-CM | POA: Diagnosis not present

## 2023-05-15 DIAGNOSIS — I2510 Atherosclerotic heart disease of native coronary artery without angina pectoris: Secondary | ICD-10-CM | POA: Diagnosis not present

## 2023-05-17 ENCOUNTER — Encounter (HOSPITAL_COMMUNITY)
Admission: RE | Admit: 2023-05-17 | Discharge: 2023-05-17 | Disposition: A | Discharge status: Home / Self Care | Payer: Medicare Other | Source: Ambulatory Visit | Attending: Cardiovascular Disease | Admitting: Cardiovascular Disease

## 2023-05-17 DIAGNOSIS — Z955 Presence of coronary angioplasty implant and graft: Secondary | ICD-10-CM | POA: Diagnosis not present

## 2023-05-17 DIAGNOSIS — I447 Left bundle-branch block, unspecified: Secondary | ICD-10-CM | POA: Diagnosis not present

## 2023-05-17 DIAGNOSIS — Z86718 Personal history of other venous thrombosis and embolism: Secondary | ICD-10-CM | POA: Diagnosis not present

## 2023-05-17 DIAGNOSIS — Z7901 Long term (current) use of anticoagulants: Secondary | ICD-10-CM | POA: Diagnosis not present

## 2023-05-17 DIAGNOSIS — I10 Essential (primary) hypertension: Secondary | ICD-10-CM | POA: Diagnosis not present

## 2023-05-17 DIAGNOSIS — I2510 Atherosclerotic heart disease of native coronary artery without angina pectoris: Secondary | ICD-10-CM | POA: Diagnosis not present

## 2023-05-17 NOTE — Progress Notes (Signed)
 Discharge Progress Report  Patient Details  Name: Kevin Caldwell. MRN: [id number] Date of Birth: 1945/11/02 Referring Provider:   Flowsheet Row INTENSIVE CARDIAC REHAB ORIENT from 02/14/2023 in [facility]  Referring Provider Kristeen Miss, MD        Number of Visits: 8  Reason for Discharge:  Patient reached a stable level of exercise. Patient independent in their exercise. Patient has met program and personal goals.  Smoking History:  Social History   Tobacco Use  Smoking Status Never  Smokeless Tobacco Never    Diagnosis:  01/28/23 S/P coronary artery stent placement  ADL [facility]:   Initial Exercise Prescription:  Initial Exercise Prescription - 02/14/23 1300       Date of Initial Exercise RX and Referring Provider   Date 02/14/23    Referring Provider Kristeen Miss, MD    Expected Discharge Date 05/08/23      Bike   Level 2    Watts 25    Minutes 15    METs 2.3      Recumbant Elliptical   Level 2    RPM 50    Watts 25    Minutes 15    METs 2.3      Prescription Details   Frequency (times per week) 3    Duration Progress to 30 minutes of continuous aerobic without signs/symptoms of physical distress      Intensity   THRR 40-80% of Max Heartrate 57-114    Ratings of Perceived Exertion 11-13    Perceived Dyspnea 0-4      Progression   Progression Continue progressive overload as per policy without signs/symptoms or physical distress.      Resistance Training   Training Prescription Yes    Weight 3 lbs    Reps 10-15             Discharge Exercise Prescription (Final Exercise Prescription Changes):  Exercise Prescription Changes - 05/17/23 1100       Response to Exercise   Blood Pressure (Admit) 98/70    Blood Pressure (Exit) 104/64    Heart Rate (Admit) 54 bpm    Heart Rate (Exercise) 132 bpm    Heart Rate (Exit) 63 bpm    Rating of Perceived Exertion (Exercise) 10     Symptoms None    Comments Pt graduated from the CRP2 program today    Duration Continue with 30 min of aerobic exercise without signs/symptoms of physical distress.    Intensity THRR unchanged      Progression   Progression Continue to progress workloads to maintain intensity without signs/symptoms of physical distress.    Average METs 4.65      Resistance Training   Training Prescription Yes    Weight 5 lbs    Reps 10-15    Time 10 Minutes      Interval Training   Interval Training No      Bike   Level 8    Minutes 15    METs 5.5      Recumbant Elliptical   Level 7    RPM 62    Watts 105    Minutes 15    METs 3.8      Home Exercise Plan   Plans to continue exercise at Home (comment)    Frequency Add 3 additional days to program exercise sessions.    Initial Home Exercises Provided 03/29/23  Functional Capacity:  6 Minute Walk     Row Name 02/14/23 1229 05/13/23 0917       6 Minute Walk   Phase Initial Discharge    Distance 1440 feet 2204 feet    Distance % Change -- 53.06 %    Distance Feet Change -- 764 ft    Walk Time 6 minutes 6 minutes    # of Rest Breaks 0 0    MPH 2.73 4.17    METS 2.3 4.13    RPE 9 10    Perceived Dyspnea  0 0    VO2 Peak 2.3 14.5    Symptoms No No    Resting HR 69 bpm 60 bpm    Resting BP 103/63 98/60    Resting Oxygen Saturation  99 % --    Exercise Oxygen Saturation  during 6 min walk 98 % --    Max Ex. HR 100 bpm 120 bpm    Max Ex. BP 119/72 140/66    2 Minute Post BP 104/70 --             Psychological, QOL, Others - Outcomes: PHQ 2/9:    05/17/2023   10:16 AM 02/14/2023    1:08 PM  Depression screen PHQ 2/9  Decreased Interest 0 0  Down, Depressed, Hopeless 0 0  PHQ - 2 Score 0 0  Altered sleeping 0 0  Tired, decreased energy 0 0  Change in appetite 0 0  Feeling bad or failure about yourself  0 0  Trouble concentrating 0 0  Moving slowly or fidgety/restless 0 0  Suicidal thoughts 0 0   PHQ-9 Score 0 0  Difficult doing work/chores Not difficult at all Not difficult at all    Quality of Life:  Quality of Life - 05/10/23 0825       Quality of Life Scores   Health/Function Pre 29.08 %    Health/Function Post 29.6 %    Health/Function % Change 1.79 %    Socioeconomic Pre 30 %    Socioeconomic Post 30 %    Socioeconomic % Change  0 %    Psych/Spiritual Pre 30 %    Psych/Spiritual Post 30 %    Psych/Spiritual % Change 0 %    Family Pre 30 %    Family Post 30 %    Family % Change 0 %    GLOBAL Pre 29.63 %    GLOBAL Post 29.82 %    GLOBAL % Change 0.64 %             Personal Goals: Goals established at orientation with interventions provided to work toward goal.  Personal Goals and Risk Factors at Admission - 02/14/23 1305       Core Components/Risk Factors/Patient Goals on Admission    Weight Management Yes;Obesity;Weight Loss    Intervention Weight Management: Develop a combined nutrition and exercise program designed to reach desired caloric intake, while maintaining appropriate intake of nutrient and fiber, sodium and fats, and appropriate energy expenditure required for the weight goal.;Weight Management: Provide education and appropriate resources to help participant work on and attain dietary goals.;Weight Management/Obesity: Establish reasonable short term and long term weight goals.;Obesity: Provide education and appropriate resources to help participant work on and attain dietary goals.    Admit Weight 243 lb 9.7 oz (110.5 kg)    Expected Outcomes Short Term: Continue to assess and modify interventions until short term weight is achieved;Long Term: Adherence to nutrition and physical  activity/exercise program aimed toward attainment of established weight goal;Weight Loss: Understanding of general recommendations for a balanced deficit meal plan, which promotes 1-2 lb weight loss per week and includes a negative energy balance of [phone]  kcal/d;Understanding recommendations for meals to include 15-35% energy as protein, 25-35% energy from fat, 35-60% energy from carbohydrates, less than 200mg  of dietary cholesterol, 20-35 gm of total fiber daily;Understanding of distribution of calorie intake throughout the day with the consumption of 4-5 meals/snacks    Hypertension Yes    Intervention Provide education on lifestyle modifcations including regular physical activity/exercise, weight management, moderate sodium restriction and increased consumption of fresh fruit, vegetables, and low fat dairy, alcohol moderation, and smoking cessation.;Monitor prescription use compliance.    Expected Outcomes Short Term: Continued assessment and intervention until BP is < 140/21mm HG in hypertensive participants. < 130/51mm HG in hypertensive participants with diabetes, heart failure or chronic kidney disease.;Long Term: Maintenance of blood pressure at goal levels.    Lipids Yes    Intervention Provide education and support for participant on nutrition & aerobic/resistive exercise along with prescribed medications to achieve LDL 70mg , HDL >40mg .    Expected Outcomes Short Term: Participant states understanding of desired cholesterol values and is compliant with medications prescribed. Participant is following exercise prescription and nutrition guidelines.;Long Term: Cholesterol controlled with medications as prescribed, with individualized exercise RX and with personalized nutrition plan. Value goals: LDL < 70mg , HDL > 40 mg.    Stress Yes    Intervention Offer individual and/or small group education and counseling on adjustment to heart disease, stress management and health-related lifestyle change. Teach and support self-help strategies.    Expected Outcomes Short Term: Participant demonstrates changes in health-related behavior, relaxation and other stress management skills, ability to obtain effective social support, and compliance with psychotropic  medications if prescribed.;Long Term: Emotional wellbeing is indicated by absence of clinically significant psychosocial distress or social isolation.              Personal Goals Discharge:  Goals and Risk Factor Review     Row Name 02/19/23 1610 03/08/23 1109 04/04/23 0910 05/07/23 1109       Core Components/Risk Factors/Patient Goals Review   Personal Goals Review Weight Management/Obesity;Lipids;Hypertension;Stress Weight Management/Obesity;Lipids;Hypertension;Stress Weight Management/Obesity;Lipids;Hypertension;Stress Weight Management/Obesity;Lipids;Hypertension;Stress    Review Nadine Counts started cardiac rehab on 02/18/23. Nadine Counts did well with exercise. Systolic BP's were in the lower 100's will continue to monitor BP's, asymptomatic. Nadine Counts has been doing well with exercise at cardiac rehab. Vital signs have been stable. Reported feeling lightheaded at home on 03/07/23. Orthostatic BP's were checked.Encouraged Mr Tuminello to drink at least 40-60 ounces of water daily and not to fast prior to exercise at cardiac rehab. Onsite provider notifed about symptoms. Nadine Counts has lost 0.8 kg since starting cardiac rehab. Nadine Counts has been doing well with exercise at cardiac rehab. Resting systolic BP's have been in the 90's. Nadine Counts remains asymptomatic during exercise. Will continue to montior BP.  Nadine Counts has lost 3.6 kg since starting cardiac rehab. Nadine Counts continues to do well with exercise at cardiac rehab. [name]'s valsartan was reduced to 80 mg a day by Dr Elease Hashimoto. Nadine Counts has lost 7.1 kg since starting cardiac rehab. Nadine Counts will complete cardiac rehab tenatively on 05/17/23.    Expected Outcomes Nadine Counts will conitnue to participate in cardiac rehab for exercise, nutriton and lifestyle modificaitons Nadine Counts will conitnue to participate in cardiac rehab for exercise, nutriton and lifestyle modificaitons Nadine Counts will conitnue to participate in cardiac rehab for exercise, nutriton and  lifestyle modificaitons Nadine Counts will conitnue to participate in cardiac rehab  for exercise, nutriton and lifestyle modificaitons             Exercise Goals and Review:  Exercise Goals     Row Name 02/14/23 1104             Exercise Goals   Increase Physical Activity Yes       Intervention Provide advice, education, support and counseling about physical activity/exercise needs.;Develop an individualized exercise prescription for aerobic and resistive training based on initial evaluation findings, risk stratification, comorbidities and participant's personal goals.       Expected Outcomes Long Term: Exercising regularly at least 3-5 days a week.;Short Term: Attend rehab on a regular basis to increase amount of physical activity.;Long Term: Add in home exercise to make exercise part of routine and to increase amount of physical activity.       Increase Strength and Stamina Yes       Intervention Provide advice, education, support and counseling about physical activity/exercise needs.;Develop an individualized exercise prescription for aerobic and resistive training based on initial evaluation findings, risk stratification, comorbidities and participant's personal goals.       Expected Outcomes Short Term: Increase workloads from initial exercise prescription for resistance, speed, and METs.;Short Term: Perform resistance training exercises routinely during rehab and add in resistance training at home;Long Term: Improve cardiorespiratory fitness, muscular endurance and strength as measured by increased METs and functional capacity ( )       Able to understand and use rate of perceived exertion (RPE) scale Yes       Intervention Provide education and explanation on how to use RPE scale       Expected Outcomes Short Term: Able to use RPE daily in rehab to express subjective intensity level;Long Term:  Able to use RPE to guide intensity level when exercising independently       Knowledge and understanding of Target Heart Rate Range (THRR) Yes       Intervention Provide  education and explanation of THRR including how the numbers were predicted and where they are located for reference       Expected Outcomes Short Term: Able to state/look up THRR;Short Term: Able to use daily as guideline for intensity in rehab;Long Term: Able to use THRR to govern intensity when exercising independently       Understanding of Exercise Prescription Yes       Intervention Provide education, explanation, and written materials on patient's individual exercise prescription       Expected Outcomes Short Term: Able to explain program exercise prescription;Long Term: Able to explain home exercise prescription to exercise independently                Exercise Goals Re-Evaluation:  Exercise Goals Re-Evaluation     Row Name 02/18/23 1444 03/22/23 1628 04/24/23 1227 05/17/23 1152       Exercise Goal Re-Evaluation   Exercise Goals Review Increase Physical Activity;Increase Strength and Stamina;Able to understand and use rate of perceived exertion (RPE) scale;Knowledge and understanding of Target Heart Rate Range (THRR);Understanding of Exercise Prescription Increase Physical Activity;Increase Strength and Stamina;Able to understand and use rate of perceived exertion (RPE) scale;Knowledge and understanding of Target Heart Rate Range (THRR);Understanding of Exercise Prescription Increase Physical Activity;Increase Strength and Stamina;Able to understand and use rate of perceived exertion (RPE) scale;Knowledge and understanding of Target Heart Rate Range (THRR);Understanding of Exercise Prescription Increase Physical Activity;Increase Strength and Stamina;Able to understand and use rate  of perceived exertion (RPE) scale;Knowledge and understanding of Target Heart Rate Range (THRR);Understanding of Exercise Prescription    Comments Pt's first day in hte CRP2 program. Pt understnads the RPE scale, THRR and Exercise Rx. Reviewed METs and goals today. Pt voices he is making progress on his goal of  improving his strength and stamina. Reviewed METs and goals today. Pt voices he is making progress on his goal of improving his strength and stamina. Pt is travleing again, made dietary charges and achieved 5.6 kg weight loss. Pt graduated from the CRP2 program today. Pt had peak MEts of 5.2. Pt made good progress on his goals of weight loss, improving strength and stamina, making dietiary changs, and exercising without concern.    Expected Outcomes Will continue to monitor the patient and progress exercise workloads as tolerated. Will continue to monitor the patient and progress exercise workloads as tolerated. Will continue to monitor the patient and progress exercise workloads as tolerated. Pt will continue to exercise a this gym.             Nutrition & Weight - Outcomes:  Pre Biometrics - 02/14/23 1115       Pre Biometrics   Waist Circumference 44.5 inches    Hip Circumference 47 inches    Waist to Hip Ratio 0.95 %    Triceps Skinfold 20 mm    % Body Fat 33.3 %    Grip Strength 40 kg    Flexibility 9 in    Single Leg Stand 5.18 seconds             Post Biometrics - 05/13/23 0926        Post  Biometrics   Height 5' 10.5" (1.791 m)    Weight 102.8 kg    Waist Circumference 42.5 inches    Hip Circumference 44.5 inches    Waist to Hip Ratio 0.96 %    BMI (Calculated) 32.05    Triceps Skinfold 19 mm    % Body Fat 31.3 %    Grip Strength 40 kg    Flexibility 10.5 in             Nutrition:  Nutrition Therapy & Goals - 05/17/23 1114       Nutrition Therapy   Diet Heart Healthy Diet    Drug/Food Interactions Statins/Certain Fruits      Personal Nutrition Goals   Nutrition Goal Patient to identify strategies for reducing cardiovascular risk by attending the [facility] education and nutrition series weekly.   Goal met.   Personal Goal #2 Patient to improve diet quality by using the plate method as a guide for meal planning to include lean protein/plant protein,  fruits, vegetables, whole grains, nonfat dairy as part of a well-balanced diet.   Goal met.   Personal Goal #3 Patient to identify strategies for weight loss of 0.5-2.0# per week.   goal met.   Comments Goals met.  Nadine Counts has medical history of CAD, LBBB, hx DVT, HTN, hyperlipidemia, coronary artery stenosis. LDL remains at goal of <70 (53). HTN is well controlled. He is down 17.2# since starting with our program. Nadine Counts regularly attended the [facility] education/nutrition series and has made many heart healthy dietary changes. Patient will continue to benefit from adherance to nutrition, exercise, and lifestyle modification recommendations.      Intervention Plan   Intervention Prescribe, educate and counsel regarding individualized specific dietary modifications aiming towards targeted core components such as weight, hypertension, lipid management, diabetes, heart failure and  other comorbidities.;Nutrition handout(s) given to patient.    Expected Outcomes Short Term Goal: Understand basic principles of dietary content, such as calories, fat, sodium, cholesterol and nutrients.;Long Term Goal: Adherence to prescribed nutrition plan.             Nutrition Discharge:   Education Questionnaire Score:  Knowledge Questionnaire Score - 05/10/23 0825       Knowledge Questionnaire Score   Post Score 21/24             Goals reviewed with patient; copy given to patient.Pt graduates from  Intensive/Traditional cardiac rehab program on 05/17/23 with completion of  34  exercise and  35 education sessions. Pt maintained good attendance and progressed nicely during their participation in rehab as evidenced by increased MET level.  Nadine Counts increased his distance on his post exercise walk test by 764 feet and lost 7.8 kg Medication list reconciled. Repeat  PHQ score-0  .  Pt has made significant lifestyle changes and should be commended for his success.  Nadine Counts  achieved his goals during cardiac rehab.   Pt plans to  continue exercise at home walking and going to the gym. We are proud of [name]'s progress and weight loss! Thayer Headings RN BSN

## 2023-05-24 ENCOUNTER — Other Ambulatory Visit: Payer: Self-pay | Admitting: Cardiology

## 2023-06-03 ENCOUNTER — Encounter: Payer: Self-pay | Admitting: Cardiovascular Disease

## 2023-06-03 ENCOUNTER — Ambulatory Visit (HOSPITAL_COMMUNITY): Attending: Cardiology

## 2023-06-03 DIAGNOSIS — I447 Left bundle-branch block, unspecified: Secondary | ICD-10-CM | POA: Diagnosis not present

## 2023-06-03 LAB — ECHOCARDIOGRAM COMPLETE
Area-P 1/2: 2.66 cm2
MV M vel: 2.9 m/s
MV Peak grad: 33.6 mmHg
S' Lateral: 2.9 cm

## 2023-06-07 NOTE — Progress Notes (Signed)
 Cardiology Office Note:   Date:  06/14/2023  ID:  Kevin Blocker., DOB [birth year], MRN [id number] PCP:  Emilio Aspen, MD  [facility] Providers Cardiologist:  Alverda Skeans, MD Referring MD: Emilio Aspen, *  Chief Complaint/Reason for Referral: Follow-up for coronary artery disease ASSESSMENT:    1. Coronary artery disease involving native coronary artery of native heart without angina pectoris   2. Diastolic dysfunction   3. Essential hypertension   4. Hyperlipidemia LDL goal <70   5. Aortic atherosclerosis (HCC)   6. Stage 3a chronic kidney disease (HCC)   7. BMI 32.0-32.9,adult   8. History of DVT (deep vein thrombosis)     PLAN:   In order of problems listed above: Coronary artery disease: Continue Plavix 75 mg, atorvastatin 40 mg, as needed nitroglycerin  Diastolic dysfunction: Continue valsartan 80 mg daily. Hypertension: Continue valsartan 80 mg daily.  Blood pressure is well-controlled today. Hyperlipidemia: LDL was 53 in January 2025; continue atorvastatin 40 mg. Aortic atherosclerosis: Continue Plavix 75 mg daily, atorvastatin 40 mg daily, and strict blood pressure control. CKD stage IIIa: Continue valsartan 80 mg daily. Elevated BMI: Continue diet and exercise modification. History of DVT: The patient remains on Eliquis 5 mg twice daily which she says will likely be stopped in the next month or 2.  I would like him to continue Plavix and ultimately Plavix monotherapy indefinitely once he comes off his Eliquis.           Dispo:  Return in about 6 months (around 12/14/2023).      Medication Adjustments/Labs and Tests Ordered: Current medicines are reviewed at length with the patient today.  Concerns regarding medicines are outlined above.  The following changes have been made:  no change   Labs/tests ordered: No orders of the defined types were placed in this encounter.   Medication Changes: No orders of the defined types were placed  in this encounter.   Current medicines are reviewed at length with the patient today.  The patient does not have concerns regarding medicines.  I spent 33 minutes reviewing all clinical data during and prior to this visit including all relevant imaging studies, laboratories, clinical information from other health systems and prior notes from both Cardiology and other specialties, interviewing the patient, conducting a complete physical examination, and coordinating care in order to formulate a comprehensive and personalized evaluation and treatment plan.   History of Present Illness:      FOCUSED PROBLEM LIST:   CAD PCI mid LAD; moderate RCA disease with RFR 0.12 January 2023 Diastolic dysfunction LVH, G1 DD and, EF 55 to 60% TTE March 2025 Hypertension Hyperlipidemia LP(a) 67.7 Aortic atherosclerosis Calcium score CT 2024 Left bundle branch block DVT On Eliquis CKD stage IIIa  BMI 32  April 2025:   Patient returns for routine follow-up.  He was seen recently and was doing well.  He continues to do well.  He denies any severe bleeding or bruising while on Eliquis.  He is also on Plavix.  The Eliquis he thinks will be stopping in the next month or so.  He denies any chest pain, palpitations, paroxysmal atrial dyspnea, orthopnea.  He does get a little lightheaded when he goes from sitting to standing quickly or any bends over.  He has not had any frank syncope.  He is required no as needed nitroglycerin.  He has been completely compliant with his medications.  He is otherwise well and without significant complaints today.  Current Medications: Current Meds  Medication Sig   apixaban (ELIQUIS) 5 MG TABS tablet Take 1 tablet (5 mg total) by mouth 2 (two) times daily.   atorvastatin (LIPITOR) 40 MG tablet Take 1 tablet (40 mg total) by mouth daily.   cholecalciferol (VITAMIN D) 1000 units tablet Take 1,000 Units by mouth daily.   clopidogrel (PLAVIX) 75 MG tablet TAKE 1  TABLET BY MOUTH DAILY WITH BREAKFAST   nitroGLYCERIN (NITROSTAT) 0.4 MG SL tablet Place 1 tablet (0.4 mg total) under the tongue every 5 (five) minutes as needed for chest pain.   valsartan (DIOVAN) 80 MG tablet Take 1 tablet (80 mg total) by mouth daily.     Review of Systems:   Please see the history of present illness.    All other systems reviewed and are negative.     EKGs/Labs/Other Test Reviewed:   EKG: EKG from December 2024 demonstrates sinus rhythm with left bundle branch block  EKG Interpretation Date/Time:    Ventricular Rate:    PR Interval:    QRS Duration:    QT Interval:    QTC Calculation:   R Axis:      Text Interpretation:           Risk Assessment/Calculations:          Physical Exam:   VS:  BP 116/72   Pulse 76   Ht 5' 10.5" (1.791 m)   Wt 215 lb 9.6 oz (97.8 kg)   SpO2 97%   BMI 30.50 kg/m        Wt Readings from Last 3 Encounters:  06/14/23 215 lb 9.6 oz (97.8 kg)  05/13/23 226 lb 10.1 oz (102.8 kg)  05/10/23 227 lb 6.4 oz (103.1 kg)      GENERAL:  No apparent distress, AOx3 HEENT:  No carotid bruits, +2 carotid impulses, no scleral icterus CAR: RRR  no murmurs, gallops, rubs, or thrills RES:  Clear to auscultation bilaterally ABD:  Soft, nontender, nondistended, positive bowel sounds x 4 VASC:  +2 radial pulses, +2 carotid pulses NEURO:  CN 2-12 grossly intact; motor and sensory grossly intact PSYCH:  No active depression or anxiety EXT:  No edema, ecchymosis, or cyanosis  Signed, Orbie Pyo, MD  06/14/2023 8:40 AM    [facility] [address] [place], [place], [place]  [address] Phone: [phone]; Fax: [phone]   Note:  This document was prepared using Dragon voice recognition software and may include unintentional dictation errors.

## 2023-06-14 ENCOUNTER — Ambulatory Visit: Attending: Internal Medicine | Admitting: Internal Medicine

## 2023-06-14 ENCOUNTER — Encounter: Payer: Self-pay | Admitting: Internal Medicine

## 2023-06-14 VITALS — BP 116/72 | HR 76 | Ht 70.5 in | Wt 215.6 lb

## 2023-06-14 DIAGNOSIS — Z86718 Personal history of other venous thrombosis and embolism: Secondary | ICD-10-CM | POA: Insufficient documentation

## 2023-06-14 DIAGNOSIS — I10 Essential (primary) hypertension: Secondary | ICD-10-CM | POA: Insufficient documentation

## 2023-06-14 DIAGNOSIS — E785 Hyperlipidemia, unspecified: Secondary | ICD-10-CM | POA: Diagnosis not present

## 2023-06-14 DIAGNOSIS — Z6832 Body mass index (BMI) 32.0-32.9, adult: Secondary | ICD-10-CM | POA: Insufficient documentation

## 2023-06-14 DIAGNOSIS — N1831 Chronic kidney disease, stage 3a: Secondary | ICD-10-CM | POA: Diagnosis present

## 2023-06-14 DIAGNOSIS — I2510 Atherosclerotic heart disease of native coronary artery without angina pectoris: Secondary | ICD-10-CM | POA: Diagnosis not present

## 2023-06-14 DIAGNOSIS — I700 Atherosclerosis of aorta: Secondary | ICD-10-CM | POA: Insufficient documentation

## 2023-06-14 DIAGNOSIS — I5189 Other ill-defined heart diseases: Secondary | ICD-10-CM | POA: Diagnosis not present

## 2023-06-14 NOTE — Patient Instructions (Signed)
 Medication Instructions:  No changes *If you need a refill on your cardiac medications before your next appointment, please call your pharmacy*  Lab Work: none If you have labs (blood work) drawn today and your tests are completely normal, you will receive your results only by: MyChart Message (if you have MyChart) OR A paper copy in the mail If you have any lab test that is abnormal or we need to change your treatment, we will call you to review the results.  Testing/Procedures: none  Follow-Up: At [facility], you and your health needs are our priority.  As part of our continuing mission to provide you with exceptional heart care, our providers are all part of one team.  This team includes your primary Cardiologist (physician) and Advanced Practice Providers or APPs (Physician Assistants and Nurse Practitioners) who all work together to provide you with the care you need, when you need it.  Your next appointment:   6 month(s)  Provider:   Tereso Newcomer, PA-C  We recommend signing up for the patient portal called "MyChart".  Sign up information is provided on this After Visit Summary.  MyChart is used to connect with patients for Virtual Visits (Telemedicine).  Patients are able to view lab/test results, encounter notes, upcoming appointments, etc.  Non-urgent messages can be sent to your provider as well.   To learn more about what you can do with MyChart, go to [email or web address].         1st Floor: - Lobby - Registration  - Pharmacy  - Lab - Cafe  2nd Floor: - PV Lab - Diagnostic Testing (echo, CT, nuclear med)  3rd Floor: - Vacant  4th Floor: - TCTS (cardiothoracic surgery) - AFib Clinic - Structural Heart Clinic - Vascular Surgery  - Vascular Ultrasound  5th Floor: - HeartCare Cardiology (general and EP) - Clinical Pharmacy for coumadin, hypertension, lipid, weight-loss medications, and med management appointments    Valet parking services  will be available as well.

## 2023-08-02 DIAGNOSIS — Z79899 Other long term (current) drug therapy: Secondary | ICD-10-CM | POA: Diagnosis not present

## 2023-08-02 DIAGNOSIS — E66811 Obesity, class 1: Secondary | ICD-10-CM | POA: Diagnosis not present

## 2023-08-02 DIAGNOSIS — Z86718 Personal history of other venous thrombosis and embolism: Secondary | ICD-10-CM | POA: Diagnosis not present

## 2023-08-02 DIAGNOSIS — D649 Anemia, unspecified: Secondary | ICD-10-CM | POA: Diagnosis not present

## 2023-08-02 DIAGNOSIS — I2510 Atherosclerotic heart disease of native coronary artery without angina pectoris: Secondary | ICD-10-CM | POA: Diagnosis not present

## 2023-08-02 DIAGNOSIS — E6609 Other obesity due to excess calories: Secondary | ICD-10-CM | POA: Diagnosis not present

## 2023-08-02 DIAGNOSIS — N1831 Chronic kidney disease, stage 3a: Secondary | ICD-10-CM | POA: Diagnosis not present

## 2023-08-02 DIAGNOSIS — I8291 Chronic embolism and thrombosis of unspecified vein: Secondary | ICD-10-CM | POA: Diagnosis not present

## 2023-08-02 DIAGNOSIS — I10 Essential (primary) hypertension: Secondary | ICD-10-CM | POA: Diagnosis not present

## 2023-08-02 DIAGNOSIS — N529 Male erectile dysfunction, unspecified: Secondary | ICD-10-CM | POA: Diagnosis not present

## 2023-08-09 DIAGNOSIS — I82412 Acute embolism and thrombosis of left femoral vein: Secondary | ICD-10-CM | POA: Diagnosis not present

## 2023-08-12 ENCOUNTER — Ambulatory Visit: Admitting: Cardiology

## 2023-08-26 ENCOUNTER — Other Ambulatory Visit (HOSPITAL_COMMUNITY): Payer: Self-pay

## 2023-08-26 ENCOUNTER — Encounter: Payer: Self-pay | Admitting: Internal Medicine

## 2023-08-26 MED ORDER — ATORVASTATIN CALCIUM 40 MG PO TABS: 40.0000 mg | ORAL_TABLET | Freq: Every day | ORAL | 3 refills | Status: AC

## 2024-01-07 NOTE — Progress Notes (Unsigned)
 Cardiology Office Note:   Date:  01/09/2024  ID:  Kevin Caldwell., DOB 12/06/1945, MRN [id number] PCP:  Kevin Caldwell LABOR, MD  [facility] Providers Cardiologist:  [name], MD Referring MD: Kevin Caldwell LABOR, *  Chief Complaint/Reason for Referral: Follow-up for coronary artery disease ASSESSMENT:    1. Coronary artery disease involving native coronary artery of native heart without angina pectoris   2. Diastolic dysfunction   3. Essential hypertension   4. Hyperlipidemia LDL goal <70   5. Aortic atherosclerosis   6. Stage 3a chronic kidney disease (HCC)   7. BMI 32.0-32.9,adult   8. History of DVT (deep vein thrombosis)      PLAN:   In order of problems listed above: Coronary artery disease: Continue Plavix  75 mg, atorvastatin  40 mg, as needed nitroglycerin .  Will defer assessing hs-CRP given chronic kidney disease. Diastolic dysfunction: Continue valsartan  80 mg daily.  Start Jardiance 10 mg daily Hypertension: Continue valsartan  80 mg daily.  Blood pressure is well-controlled today. Hyperlipidemia: Continue atorvastatin  40 mg.  Check lipid panel and LFTs today. Aortic atherosclerosis: Continue Plavix  75 mg daily, atorvastatin  40 mg daily, and strict blood pressure control. CKD stage IIIa: Continue valsartan  80 mg daily.  Start Jardiance 10 mg daily Elevated BMI: Continue diet and exercise modification. History of DVT: Completed Eliquis  for isolated DVT.      Dispo:  Return in about 6 months (around 07/07/2024).      Medication Adjustments/Labs and Tests Ordered: Current medicines are reviewed at length with the patient today.  Concerns regarding medicines are outlined above.  The following changes have been made:  no change   Labs/tests ordered: Orders Placed This Encounter  Procedures   Lipid panel   Hepatic function panel   EKG 12-Lead    Medication Changes: Meds ordered this encounter  Medications   empagliflozin (JARDIANCE) 10 MG TABS  tablet    Sig: Take 1 tablet (10 mg total) by mouth daily before breakfast.    Dispense:  30 tablet    Refill:  3    Current medicines are reviewed at length with the patient today.  The patient does not have concerns regarding medicines.  I spent 35 minutes reviewing all clinical data during and prior to this visit including all relevant imaging studies, laboratories, clinical information from other health systems and prior notes from both Cardiology and other specialties, interviewing the patient, conducting a complete physical examination, and coordinating care in order to formulate a comprehensive and personalized evaluation and treatment plan.   History of Present Illness:      FOCUSED PROBLEM LIST:   CAD PCI mid LAD; moderate RCA disease with RFR 0.12 January 2023 Diastolic dysfunction LVH, G1 DD and, EF 55 to 60% TTE March 2025 Hypertension Hyperlipidemia LP(a) 67.7 Aortic atherosclerosis Calcium  score CT 2024 LBBB DVT Completed Eliquis  CKD stage IIIa  BMI 32  April 2025:   Patient returns for routine follow-up.  He was seen recently and was doing well.  He continues to do well.  He denies any severe bleeding or bruising while on Eliquis .  He is also on Plavix .  The Eliquis  he thinks will be stopping in the next month or so.  He denies any chest pain, palpitations, paroxysmal atrial dyspnea, orthopnea.  He does get a little lightheaded when he goes from sitting to standing quickly or any bends over.  He has not had any frank syncope.  He is required no as needed nitroglycerin .  He has  been completely compliant with his medications.  He is otherwise well and without significant complaints today.  Plan: Continue current therapy.  November 2025:  Patient consents to use of AI scribe. The patient returns for routine follow-up. He walks about three miles daily and is trying to maintain a healthy diet. His blood pressure was slightly elevated due to agitation from business matters,  but it is generally well-controlled.  He was diagnosed with grade one diastolic dysfunction in March, with a similar condition noted in 2010. He has been informed that his heart's pumping function is fine, but the relaxing function is slightly impaired due to stiffness of the heart muscle, attributed to age, high blood pressure, and cholesterol.  He is aware of having stage three kidney disease, which he attributes to aging and high blood pressure. He recalls having stage two kidney disease in 2010, indicating a progression over the years.        Current Medications: Current Meds  Medication Sig   atorvastatin  (LIPITOR) 40 MG tablet Take 1 tablet (40 mg total) by mouth daily.   cholecalciferol (VITAMIN D) 1000 units tablet Take 1,000 Units by mouth daily.   clopidogrel  (PLAVIX ) 75 MG tablet TAKE 1 TABLET BY MOUTH DAILY WITH BREAKFAST   empagliflozin (JARDIANCE) 10 MG TABS tablet Take 1 tablet (10 mg total) by mouth daily before breakfast.   nitroGLYCERIN  (NITROSTAT ) 0.4 MG SL tablet Place 1 tablet (0.4 mg total) under the tongue every 5 (five) minutes as needed for chest pain.   sildenafil (VIAGRA) 50 MG tablet Take 50-100 mg by mouth daily.   valsartan  (DIOVAN ) 80 MG tablet Take 1 tablet (80 mg total) by mouth daily.     Review of Systems:   Please see the history of present illness.    All other systems reviewed and are negative.     EKGs/Labs/Other Test Reviewed:   EKG: EKG from December 2024 demonstrates sinus rhythm with left bundle branch block  EKG Interpretation Date/Time:  Wednesday January 08 2024 15:44:07 EST Ventricular Rate:  66 PR Interval:  196 QRS Duration:  140 QT Interval:  460 QTC Calculation: 482 R Axis:   0  Text Interpretation: Sinus bradycardia with Premature atrial complexes Left bundle branch block When compared with ECG of 08-Feb-2023 08:43, No significant change was found Confirmed by [name] ([id number]) on 01/08/2024 3:52:34 PM         Risk  Assessment/Calculations:          Physical Exam:   VS:  BP 120/70 (BP Location: Left Arm, Patient Position: Sitting)   Pulse 66   Ht 5' 10.75 (1.797 m)   Wt 214 lb 12.8 oz (97.4 kg)   SpO2 98%   BMI 30.17 kg/m        Wt Readings from Last 3 Encounters:  01/08/24 214 lb 12.8 oz (97.4 kg)  06/14/23 215 lb 9.6 oz (97.8 kg)  05/13/23 226 lb 10.1 oz (102.8 kg)      GENERAL:  No apparent distress, AOx3 HEENT:  No carotid bruits, +2 carotid impulses, no scleral icterus CAR: RRR  no murmurs, gallops, rubs, or thrills RES:  Clear to auscultation bilaterally ABD:  Soft, nontender, nondistended, positive bowel sounds x 4 VASC:  +2 radial pulses, +2 carotid pulses NEURO:  CN 2-12 grossly intact; motor and sensory grossly intact PSYCH:  No active depression or anxiety EXT:  No edema, ecchymosis, or cyanosis  Signed, Tannis Burstein K Dellie Piasecki, MD  01/09/2024 7:24 AM    [facility] [address] [place], [place], [place]  [address] Phone: [phone]; Fax: [phone]   Note:  This document was prepared using Dragon voice recognition software and may include unintentional dictation errors.

## 2024-01-08 ENCOUNTER — Ambulatory Visit: Attending: Internal Medicine | Admitting: Internal Medicine

## 2024-01-08 ENCOUNTER — Encounter: Payer: Self-pay | Admitting: Internal Medicine

## 2024-01-08 VITALS — BP 120/70 | HR 66 | Ht 70.75 in | Wt 214.8 lb

## 2024-01-08 DIAGNOSIS — Z86718 Personal history of other venous thrombosis and embolism: Secondary | ICD-10-CM | POA: Diagnosis not present

## 2024-01-08 DIAGNOSIS — Z6832 Body mass index (BMI) 32.0-32.9, adult: Secondary | ICD-10-CM | POA: Insufficient documentation

## 2024-01-08 DIAGNOSIS — I5189 Other ill-defined heart diseases: Secondary | ICD-10-CM | POA: Insufficient documentation

## 2024-01-08 DIAGNOSIS — N1831 Chronic kidney disease, stage 3a: Secondary | ICD-10-CM | POA: Diagnosis not present

## 2024-01-08 DIAGNOSIS — I700 Atherosclerosis of aorta: Secondary | ICD-10-CM | POA: Insufficient documentation

## 2024-01-08 DIAGNOSIS — E785 Hyperlipidemia, unspecified: Secondary | ICD-10-CM | POA: Insufficient documentation

## 2024-01-08 DIAGNOSIS — I2510 Atherosclerotic heart disease of native coronary artery without angina pectoris: Secondary | ICD-10-CM | POA: Diagnosis not present

## 2024-01-08 DIAGNOSIS — I10 Essential (primary) hypertension: Secondary | ICD-10-CM | POA: Insufficient documentation

## 2024-01-08 MED ORDER — EMPAGLIFLOZIN 10 MG PO TABS: 10.0000 mg | ORAL_TABLET | Freq: Every day | ORAL | 3 refills | Status: DC

## 2024-01-08 NOTE — Patient Instructions (Signed)
 Medication Instructions:  START Jardiance 10 mg once daily   *If you need a refill on your cardiac medications before your next appointment, please call your pharmacy*  Lab Work: To be completed today: lipid panel and LFT  If you have labs (blood work) drawn today and your tests are completely normal, you will receive your results only by: MyChart Message (if you have MyChart) OR A paper copy in the mail If you have any lab test that is abnormal or we need to change your treatment, we will call you to review the results.  Testing/Procedures: None ordered today.  Follow-Up: At [facility], you and your health needs are our priority.  As part of our continuing mission to provide you with exceptional heart care, our providers are all part of one team.  This team includes your primary Cardiologist (physician) and Advanced Practice Providers or APPs (Physician Assistants and Nurse Practitioners) who all work together to provide you with the care you need, when you need it.  Your next appointment:   6 month(s)  Provider:   Arun Thukkani, MD

## 2024-01-09 ENCOUNTER — Encounter: Payer: Self-pay | Admitting: Internal Medicine

## 2024-01-09 ENCOUNTER — Ambulatory Visit: Payer: Self-pay | Admitting: Internal Medicine

## 2024-01-09 LAB — LIPID PANEL
Chol/HDL Ratio: 2.8 ratio (ref 0.0–5.0)
Cholesterol, Total: 125 mg/dL (ref 100–199)
HDL: 45 mg/dL (ref >39)
LDL Chol Calc (NIH): 62 mg/dL (ref 0–99)
Triglycerides: 94 mg/dL (ref 0–149)
VLDL Cholesterol Cal: 18 mg/dL (ref 5–40)

## 2024-01-09 LAB — HEPATIC FUNCTION PANEL
ALT: 18 IU/L (ref 0–44)
AST: 25 IU/L (ref 0–40)
Albumin: 4.5 g/dL (ref 3.8–4.8)
Alkaline Phosphatase: 57 IU/L (ref 47–123)
Bilirubin Total: 1.1 mg/dL (ref 0.0–1.2)
Bilirubin, Direct: 0.33 mg/dL (ref 0.00–0.40)
Total Protein: 7.2 g/dL (ref 6.0–8.5)

## 2024-01-27 ENCOUNTER — Other Ambulatory Visit: Payer: Self-pay

## 2024-01-27 MED ORDER — VALSARTAN 80 MG PO TABS: 80.0000 mg | ORAL_TABLET | Freq: Every day | ORAL | 3 refills | Status: AC

## 2024-03-25 ENCOUNTER — Other Ambulatory Visit: Payer: Self-pay | Admitting: Internal Medicine

## 2024-04-01 MED ORDER — CLOPIDOGREL BISULFATE 75 MG PO TABS: 75.0000 mg | ORAL_TABLET | Freq: Every day | ORAL | 3 refills | Status: AC

## 2024-04-01 MED ORDER — EMPAGLIFLOZIN 10 MG PO TABS: 10.0000 mg | ORAL_TABLET | Freq: Every day | ORAL | 3 refills | Status: AC

## 2024-04-01 NOTE — Telephone Encounter (Signed)
 In accordance with refill protocols, please review and address the following requirements before this medication refill can be authorized:  Labs

## 2024-07-08 ENCOUNTER — Ambulatory Visit: Admitting: Physician Assistant
